# Patient Record
Sex: Male | Born: 1975
Health system: Southern US, Community
[De-identification: ages and names within clinical notes are randomized; demographics above are authoritative.]

## PROBLEM LIST (undated history)

## (undated) ENCOUNTER — Emergency Department (HOSPITAL_COMMUNITY): Payer: Self-pay | Source: Home / Self Care

## (undated) DIAGNOSIS — E785 Hyperlipidemia, unspecified: Secondary | ICD-10-CM

## (undated) DIAGNOSIS — I8390 Asymptomatic varicose veins of unspecified lower extremity: Secondary | ICD-10-CM

## (undated) DIAGNOSIS — K429 Umbilical hernia without obstruction or gangrene: Secondary | ICD-10-CM

## (undated) DIAGNOSIS — N200 Calculus of kidney: Secondary | ICD-10-CM

## (undated) DIAGNOSIS — J309 Allergic rhinitis, unspecified: Secondary | ICD-10-CM

## (undated) HISTORY — PX: FOOT SURGERY: SHX648

## (undated) HISTORY — DX: Allergic rhinitis, unspecified: J30.9

---

## 2005-12-27 ENCOUNTER — Inpatient Hospital Stay (HOSPITAL_COMMUNITY): Admission: EM | Admit: 2005-12-27 | Discharge: 2005-12-28 | Payer: Self-pay | Admitting: Emergency Medicine

## 2006-01-04 ENCOUNTER — Emergency Department (HOSPITAL_COMMUNITY): Admission: EM | Admit: 2006-01-04 | Discharge: 2006-01-04 | Payer: Self-pay | Admitting: Family Medicine

## 2006-05-04 ENCOUNTER — Emergency Department (HOSPITAL_COMMUNITY): Admission: EM | Admit: 2006-05-04 | Discharge: 2006-05-04 | Payer: Self-pay | Admitting: Family Medicine

## 2009-05-13 ENCOUNTER — Emergency Department (HOSPITAL_COMMUNITY): Admission: EM | Admit: 2009-05-13 | Discharge: 2009-05-13 | Payer: Self-pay | Admitting: Family Medicine

## 2009-07-27 ENCOUNTER — Ambulatory Visit: Payer: Self-pay | Admitting: Internal Medicine

## 2009-09-24 ENCOUNTER — Ambulatory Visit: Payer: Self-pay | Admitting: Internal Medicine

## 2009-09-24 ENCOUNTER — Encounter (INDEPENDENT_AMBULATORY_CARE_PROVIDER_SITE_OTHER): Payer: Self-pay | Admitting: Family Medicine

## 2009-09-24 LAB — CONVERTED CEMR LAB
AST: 27 units/L (ref 0–37)
BUN: 14 mg/dL (ref 6–23)
Basophils Absolute: 0.1 10*3/uL (ref 0.0–0.1)
CO2: 22 meq/L (ref 19–32)
Chloride: 106 meq/L (ref 96–112)
Creatinine, Ser: 0.71 mg/dL (ref 0.40–1.50)
Eosinophils Absolute: 0.3 10*3/uL (ref 0.0–0.7)
Eosinophils Relative: 4 % (ref 0–5)
HCT: 45.2 % (ref 39.0–52.0)
Lymphs Abs: 2 10*3/uL (ref 0.7–4.0)
Monocytes Absolute: 0.4 10*3/uL (ref 0.1–1.0)
Neutro Abs: 3.4 10*3/uL (ref 1.7–7.7)
Platelets: 208 10*3/uL (ref 150–400)
Total Bilirubin: 0.5 mg/dL (ref 0.3–1.2)
WBC: 6.2 10*3/uL (ref 4.0–10.5)

## 2009-09-26 ENCOUNTER — Ambulatory Visit (HOSPITAL_COMMUNITY): Admission: RE | Admit: 2009-09-26 | Discharge: 2009-09-26 | Payer: Self-pay | Admitting: Family Medicine

## 2009-11-05 ENCOUNTER — Ambulatory Visit: Payer: Self-pay | Admitting: Internal Medicine

## 2010-06-23 ENCOUNTER — Encounter: Payer: Self-pay | Admitting: Family Medicine
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
## 2010-12-24 ENCOUNTER — Other Ambulatory Visit (HOSPITAL_COMMUNITY): Payer: Self-pay | Admitting: Family Medicine

## 2010-12-24 ENCOUNTER — Ambulatory Visit (HOSPITAL_COMMUNITY)
Admission: RE | Admit: 2010-12-24 | Discharge: 2010-12-24 | Disposition: A | Discharge status: Home / Self Care | Payer: Self-pay | Source: Ambulatory Visit | Attending: Family Medicine | Admitting: Family Medicine

## 2010-12-24 DIAGNOSIS — R109 Unspecified abdominal pain: Secondary | ICD-10-CM | POA: Insufficient documentation

## 2011-03-16 ENCOUNTER — Inpatient Hospital Stay (INDEPENDENT_AMBULATORY_CARE_PROVIDER_SITE_OTHER)
Admission: RE | Admit: 2011-03-16 | Discharge: 2011-03-16 | Disposition: A | Discharge status: Home / Self Care | Payer: Self-pay | Source: Ambulatory Visit | Attending: Family Medicine | Admitting: Family Medicine

## 2011-03-16 DIAGNOSIS — J310 Chronic rhinitis: Secondary | ICD-10-CM

## 2011-03-16 DIAGNOSIS — J019 Acute sinusitis, unspecified: Secondary | ICD-10-CM

## 2012-10-07 ENCOUNTER — Emergency Department (HOSPITAL_COMMUNITY)
Admission: EM | Admit: 2012-10-07 | Discharge: 2012-10-08 | Disposition: A | Discharge status: Home / Self Care | Payer: Self-pay | Attending: Emergency Medicine | Admitting: Emergency Medicine

## 2012-10-07 ENCOUNTER — Other Ambulatory Visit: Payer: Self-pay

## 2012-10-07 ENCOUNTER — Emergency Department (HOSPITAL_COMMUNITY): Payer: Self-pay

## 2012-10-07 ENCOUNTER — Encounter (HOSPITAL_COMMUNITY): Payer: Self-pay | Admitting: *Deleted

## 2012-10-07 DIAGNOSIS — Z8639 Personal history of other endocrine, nutritional and metabolic disease: Secondary | ICD-10-CM | POA: Insufficient documentation

## 2012-10-07 DIAGNOSIS — R110 Nausea: Secondary | ICD-10-CM | POA: Insufficient documentation

## 2012-10-07 DIAGNOSIS — J020 Streptococcal pharyngitis: Secondary | ICD-10-CM | POA: Insufficient documentation

## 2012-10-07 DIAGNOSIS — R079 Chest pain, unspecified: Secondary | ICD-10-CM | POA: Insufficient documentation

## 2012-10-07 DIAGNOSIS — R509 Fever, unspecified: Secondary | ICD-10-CM | POA: Insufficient documentation

## 2012-10-07 DIAGNOSIS — R1084 Generalized abdominal pain: Secondary | ICD-10-CM | POA: Insufficient documentation

## 2012-10-07 DIAGNOSIS — F17200 Nicotine dependence, unspecified, uncomplicated: Secondary | ICD-10-CM | POA: Insufficient documentation

## 2012-10-07 DIAGNOSIS — M549 Dorsalgia, unspecified: Secondary | ICD-10-CM | POA: Insufficient documentation

## 2012-10-07 DIAGNOSIS — Z862 Personal history of diseases of the blood and blood-forming organs and certain disorders involving the immune mechanism: Secondary | ICD-10-CM | POA: Insufficient documentation

## 2012-10-07 DIAGNOSIS — J329 Chronic sinusitis, unspecified: Secondary | ICD-10-CM | POA: Insufficient documentation

## 2012-10-07 DIAGNOSIS — R0602 Shortness of breath: Secondary | ICD-10-CM | POA: Insufficient documentation

## 2012-10-07 DIAGNOSIS — H53149 Visual discomfort, unspecified: Secondary | ICD-10-CM | POA: Insufficient documentation

## 2012-10-07 HISTORY — DX: Hyperlipidemia, unspecified: E78.5

## 2012-10-07 LAB — CBC WITH DIFFERENTIAL/PLATELET
Basophils Absolute: 0 10*3/uL (ref 0.0–0.1)
Basophils Relative: 0 % (ref 0–1)
HCT: 39.9 % (ref 39.0–52.0)
Hemoglobin: 14.3 g/dL (ref 13.0–17.0)
Lymphs Abs: 1.2 10*3/uL (ref 0.7–4.0)
MCH: 30.5 pg (ref 26.0–34.0)
MCV: 85.1 fL (ref 78.0–100.0)
Monocytes Relative: 7 % (ref 3–12)
Platelets: 167 10*3/uL (ref 150–400)
RDW: 12.4 % (ref 11.5–15.5)

## 2012-10-07 LAB — BASIC METABOLIC PANEL
CO2: 26 mEq/L (ref 19–32)
GFR calc Af Amer: >90 mL/min (ref >90)
Glucose, Bld: 124 mg/dL — ABNORMAL HIGH (ref 70–99)
Potassium: 3.7 mEq/L (ref 3.5–5.1)
Sodium: 135 mEq/L (ref 135–145)

## 2012-10-07 LAB — URINALYSIS, ROUTINE W REFLEX MICROSCOPIC
Ketones, ur: NEGATIVE mg/dL
Leukocytes, UA: NEGATIVE
Nitrite: NEGATIVE
Specific Gravity, Urine: 1.024 (ref 1.005–1.030)

## 2012-10-07 LAB — POCT I-STAT TROPONIN I

## 2012-10-07 MED ORDER — ONDANSETRON HCL 4 MG/2ML IJ SOLN
4.0000 mg | Freq: Once | INTRAMUSCULAR | Status: AC
  Administered 2012-10-08: 4 mg via INTRAVENOUS
  Filled 2012-10-07: qty 2

## 2012-10-07 MED ORDER — MORPHINE SULFATE 4 MG/ML IJ SOLN
4.0000 mg | Freq: Once | INTRAMUSCULAR | Status: AC
  Administered 2012-10-08: 4 mg via INTRAVENOUS
  Filled 2012-10-07: qty 1

## 2012-10-07 MED ORDER — ACETAMINOPHEN 325 MG PO TABS
650.0000 mg | ORAL_TABLET | Freq: Four times a day (QID) | ORAL | Status: DC | PRN
  Administered 2012-10-08: 650 mg via ORAL
  Filled 2012-10-07: qty 2

## 2012-10-07 MED ORDER — SODIUM CHLORIDE 0.9 % IV BOLUS (SEPSIS)
1000.0000 mL | Freq: Once | INTRAVENOUS | Status: AC
  Administered 2012-10-08: 1000 mL via INTRAVENOUS

## 2012-10-07 MED ORDER — SODIUM CHLORIDE 0.9 % IV BOLUS (SEPSIS)
2000.0000 mL | Freq: Once | INTRAVENOUS | Status: AC
  Administered 2012-10-08: 2000 mL via INTRAVENOUS

## 2012-10-07 NOTE — ED Notes (Signed)
Patient transported to X-ray 

## 2012-10-07 NOTE — ED Notes (Addendum)
Really bad h/a and fever, sob, substernal cp.  Onset: today 12 Noon. Took Suphedrine and some old amoxicillin. Started feeling this way after having a burger at [employer]. [place].

## 2012-10-07 NOTE — ED Provider Notes (Signed)
History     CSN: [id number]  Arrival date & time 10/07/12  2222   First MD Initiated Contact with Patient 10/07/12 2326      Chief Complaint  Patient presents with  . Fever  . Headache  . Back Pain  . Chest Pain    (Consider location/radiation/quality/duration/timing/severity/associated sxs/prior treatment) HPI  Fred Mullins is a 37 y.o. male complaining acute onset of headache, shortness of breath, chest pain, nausea and sore throat at 12 PM. Patient felt fine this morning and went to work is normal. He denies sick contacts, cough, rhinorrhea, emesis, diarrhea, neck stiffness, rash. Patient has past medical history significant for hyperlipidemia he is currently not being treated for this he has no primary care physician. Patient states the pain is bothering him the most is the headache, it is periorbital and bilateral. Patient endorses a mild photophobia and diffuse abdominal discomdort. He states his chest pain is bilateral upper anterior and rated as 6/10. Patient works outside as a Administrator, denies any known recent tick bite.     Past Medical History  Diagnosis Date  . Hyperlipemia     No past surgical history on file.  No family history on file.  History  Substance Use Topics  . Smoking status: Light Tobacco Smoker  . Smokeless tobacco: Not on file  . Alcohol Use: Yes      Review of Systems  Constitutional: Positive for fever.  Respiratory: Positive for shortness of breath.   Cardiovascular: Negative for chest pain.  Gastrointestinal: Positive for nausea. Negative for vomiting, abdominal pain and diarrhea.  Neurological: Positive for headaches.  All other systems reviewed and are negative.    Allergies  Review of patient's allergies indicates no known allergies.  Home Medications  No current outpatient prescriptions on file.  BP 122/56  Pulse 104  Temp(Src) 101.1 F (38.4 C) (Oral)  Resp 16  SpO2 97%  Physical Exam  Nursing note and vitals  reviewed. Constitutional: He is oriented to person, place, and time. He appears well-developed and well-nourished. No distress.  Warm to the touch  HENT:  Head: Normocephalic.  Mouth/Throat: Oropharynx is clear and moist.  Poorly visualized tonsils.  Anterior cervical lymphadenopathy with very mild tenderness  Eyes: Conjunctivae and EOM are normal. Pupils are equal, round, and reactive to light.  Neck: Normal range of motion. Neck supple. No JVD present. No thyromegaly present.  Patient has full range of motion to neck. No tenderness to deep palpation of the posterior cervical spine. Patient can flex chin to chest with no pain.  +Acanthosis Nigricans  Cardiovascular: Normal rate, regular rhythm, normal heart sounds and intact distal pulses.   Pulmonary/Chest: Effort normal and breath sounds normal. No stridor. No respiratory distress. He has no wheezes. He has no rales. He exhibits no tenderness.  Abdominal: Soft. Bowel sounds are normal. He exhibits no distension and no mass. There is no tenderness. There is no rebound and no guarding.  Musculoskeletal: Normal range of motion. He exhibits no edema and no tenderness.  Neurological: He is alert and oriented to person, place, and time.  Skin: Skin is warm. No rash noted.  Psychiatric: He has a normal mood and affect.    ED Course  Procedures (including critical care time)  Labs Reviewed  RAPID STREP SCREEN - Abnormal; Notable for the following:    Streptococcus, Group A Screen (Direct) POSITIVE (*)    All other components within normal limits  BASIC METABOLIC PANEL - Abnormal; Notable for the following:  Glucose, Bld 124 (*)    GFR calc non Af Amer 87 (*)    All other components within normal limits  CBC WITH DIFFERENTIAL - Abnormal; Notable for the following:    WBC 11.7 (*)    Neutrophils Relative 82 (*)    Neutro Abs 9.6 (*)    Lymphocytes Relative 10 (*)    All other components within normal limits  URINALYSIS, ROUTINE W  REFLEX MICROSCOPIC  POCT I-STAT TROPONIN I  POCT I-STAT TROPONIN I   Dg Chest 2 View  10/07/2012  *RADIOLOGY REPORT*  Clinical Data: 37 year old male.  Fever headache back pain chest pain.  CHEST - 2 VIEW  Comparison: 09/26/2009.  Findings: Lower lung volumes.  Cardiac size at the upper limits of normal. Other mediastinal contours are within normal limits. Visualized tracheal air column is within normal limits.  No pneumothorax or pleural effusion.  Mild increased interstitial markings, favor crowding.  No consolidation. No acute osseous abnormality identified.  IMPRESSION: Lower lung volumes, otherwise no acute cardiopulmonary abnormality.   Original Report Authenticated By: Erskine Speed, M.D.    Ct Head Wo Contrast  10/08/2012  *RADIOLOGY REPORT*  Clinical Data: 37 year old male with frontal headache.  Severe headache.  CT HEAD WITHOUT CONTRAST  Technique:  Contiguous axial images were obtained from the base of the skull through the vertex without contrast.  Comparison: None.  Findings: Mild anterior ethmoid sinus mucosal thickening.  Trace bubbly opacity in the sphenoid sinuses.  Other Visualized paranasal sinuses and mastoids are clear.  No acute osseous abnormality identified.  Visualized orbits and scalp soft tissues are within normal limits.  Normal cerebral volume. Cerebral volume is within normal limits for age.  No midline shift, ventriculomegaly, mass effect, evidence of mass lesion, intracranial hemorrhage or evidence of cortically based acute infarction.  Gray-white matter differentiation is within normal limits throughout the brain.  No suspicious intracranial vascular hyperdensity.  IMPRESSION: 1. Normal noncontrast CT appearance of the brain. 2.  Mild ethmoid and sphenoid sinus inflammatory changes.   Original Report Authenticated By: Erskine Speed, M.D.     Date: 10/08/2012  Rate: 108  Rhythm: Sinus tachycardia  QRS Axis: normal  Intervals: normal  ST/T Wave abnormalities: normal   Conduction Disutrbances:none  Narrative Interpretation:   Old EKG Reviewed: None available   1. Strep pharyngitis   2. Sinusitis       MDM   Fred Mullins is a 37 y.o. male with headache, myalgia, fever, sore throat. Patient has no meningeal signs. He denies sore throat. Patient works outside however he does not have any rash or any recent tick bites.  CT shows sinus this. We'll treat him with amoxicillin and fluticasone nasal spray.  I think his chest pain is related to diffuse myalgia from the strep throat.  I think his pain is related to stress and sinusitis doubt meningitis or Mountain spotted fever at this time.  Filed Vitals:   10/07/12 2241  BP: 122/56  Pulse: 104  Temp: 101.1 F (38.4 C)  TempSrc: Oral  Resp: 16  SpO2: 97%     VSS and patient is appropriate for, and amenable to, discharge at this time. Pt verbalized understanding and agrees with care plan. Outpatient follow-up and return precautions given.    New Prescriptions   AMOXICILLIN (AMOXIL) 500 MG CAPSULE    Take 1 capsule (500 mg total) by mouth 3 (three) times daily.   FLUTICASONE (FLONASE) 50 MCG/ACT NASAL SPRAY    Place 2 sprays  into the nose daily.           Wynetta Emery, PA-C 10/08/12 0148

## 2012-10-08 ENCOUNTER — Emergency Department (HOSPITAL_COMMUNITY): Payer: Self-pay

## 2012-10-08 LAB — POCT I-STAT TROPONIN I: Troponin i, poc: 0 ng/mL (ref 0.00–0.08)

## 2012-10-08 LAB — RAPID STREP SCREEN (MED CTR MEBANE ONLY): Streptococcus, Group A Screen (Direct): POSITIVE — AB

## 2012-10-08 MED ORDER — AMOXICILLIN 500 MG PO CAPS: 500.0000 mg | ORAL_CAPSULE | Freq: Three times a day (TID) | ORAL | Status: DC

## 2012-10-08 MED ORDER — LIDOCAINE VISCOUS 2 % MT SOLN
20.0000 mL | Freq: Once | OROMUCOSAL | Status: AC
  Administered 2012-10-08: 20 mL via OROMUCOSAL
  Filled 2012-10-08: qty 15

## 2012-10-08 MED ORDER — PENICILLIN G BENZATHINE 1200000 UNIT/2ML IM SUSP
1.2000 10*6.[IU] | Freq: Once | INTRAMUSCULAR | Status: AC
  Administered 2012-10-08: 1.2 10*6.[IU] via INTRAMUSCULAR
  Filled 2012-10-08: qty 2

## 2012-10-08 MED ORDER — FLUTICASONE PROPIONATE 50 MCG/ACT NA SUSP: 2.0000 | Freq: Every day | NASAL | Status: DC

## 2012-10-08 NOTE — ED Provider Notes (Signed)
Medical screening examination/treatment/procedure(s) were performed by non-physician practitioner and as supervising physician I was immediately available for consultation/collaboration.  Jasmine Awe, MD 10/08/12 [phone]

## 2012-12-15 ENCOUNTER — Ambulatory Visit: Payer: No Typology Code available for payment source | Attending: Family Medicine | Admitting: Family Medicine

## 2012-12-15 ENCOUNTER — Encounter: Payer: Self-pay | Admitting: Family Medicine

## 2012-12-15 VITALS — BP 110/62 | HR 72 | Temp 99.1°F | Resp 18 | Ht 67.0 in | Wt 197.0 lb

## 2012-12-15 DIAGNOSIS — K029 Dental caries, unspecified: Secondary | ICD-10-CM

## 2012-12-15 DIAGNOSIS — J309 Allergic rhinitis, unspecified: Secondary | ICD-10-CM

## 2012-12-15 DIAGNOSIS — G4710 Hypersomnia, unspecified: Secondary | ICD-10-CM | POA: Insufficient documentation

## 2012-12-15 DIAGNOSIS — R51 Headache: Secondary | ICD-10-CM

## 2012-12-15 HISTORY — DX: Allergic rhinitis, unspecified: J30.9

## 2012-12-15 MED ORDER — CETIRIZINE HCL 10 MG PO TABS: 10.0000 mg | ORAL_TABLET | Freq: Every day | ORAL | Status: DC

## 2012-12-15 MED ORDER — FLUTICASONE PROPIONATE 50 MCG/ACT NA SUSP: 2.0000 | Freq: Every day | NASAL | Status: DC

## 2012-12-15 MED ORDER — TRAZODONE HCL 50 MG PO TABS: 50.0000 mg | ORAL_TABLET | Freq: Every evening | ORAL | Status: DC | PRN

## 2012-12-15 NOTE — Progress Notes (Signed)
12/15/12 Patient present to clinic to establish care. Stated he needs A dental referral. C/O headache  Intermittently. P.Dell Hurtubise,RN BSN [facility]

## 2012-12-15 NOTE — Progress Notes (Signed)
Patient ID: Fred Mullins, male   DOB: [birth year], 37 y.o.   MRN: [id number]  CC: headaches  INterpreter  Used    HPI: Pt reports that he has intermittent frontal headaches and has been present for long time.  He is not sleeping well.  He is snoring and waking up to gasp for air at times.  He is tired all the time.  He has never been evaluated for OSA.  He says headaches have been 1-2/10 in severity.  HE says he is having a difficult time sleeping.   No Known Allergies Past Medical History  Diagnosis Date  . Hyperlipemia    No current outpatient prescriptions on file prior to visit.   No current facility-administered medications on file prior to visit.   Family History  Problem Relation Age of Onset  . Hyperlipidemia Mother   . Diabetes Father    History   Social History  . Marital Status: Single    Spouse Name: N/A    Number of Children: N/A  . Years of Education: N/A   Occupational History  . Not on file.   Social History Main Topics  . Smoking status: Light Tobacco Smoker  . Smokeless tobacco: Not on file  . Alcohol Use: Yes  . Drug Use: No  . Sexually Active: Not on file   Other Topics Concern  . Not on file   Social History Narrative  . No narrative on file    Review of Systems  Constitutional: Negative for fever, chills, diaphoresis, activity change, appetite change and positive for chronic fatigue and daytime somnolence.   HENT: Negative for ear pain, nosebleeds, congestion, facial swelling, rhinorrhea, neck pain, neck stiffness and ear discharge.   Eyes: Negative for pain, discharge, redness, itching and visual disturbance.  Respiratory: Negative for cough, choking, chest tightness, shortness of breath, wheezing and stridor.   Cardiovascular: Negative for chest pain, palpitations and leg swelling.  Gastrointestinal: Negative for abdominal distention.  Genitourinary: Negative for dysuria, urgency, frequency, hematuria, flank pain, decreased urine volume,  difficulty urinating and dyspareunia.  Musculoskeletal: Negative for back pain, joint swelling, arthralgias and gait problem.  Neurological: Negative for dizziness, tremors, seizures, syncope, facial asymmetry, speech difficulty, weakness, light-headedness, numbness and headaches.  Hematological: Negative for adenopathy. Does not bruise/bleed easily.  Psychiatric/Behavioral: Negative for hallucinations, behavioral problems, confusion, dysphoric mood, decreased concentration and agitation.    Objective:   Filed Vitals:   12/15/12 1605  BP: 110/62  Pulse: 72  Temp: 99.1 F (37.3 C)  Resp: 18    Physical Exam  Constitutional: Appears well-developed and well-nourished. No distress.  HENT: Normocephalic. External right and left ear normal. Oropharynx is clear and moist. dental caries. Eyes: Conjunctivae and EOM are normal. PERRLA, no scleral icterus.  Neck: Normal ROM. Neck supple. No JVD. No tracheal deviation. No thyromegaly.  CVS: RRR, S1/S2 +, no murmurs, no gallops, no carotid bruit.  Pulmonary: Effort and breath sounds normal, no stridor, rhonchi, wheezes, rales.  Abdominal: Soft. BS +,  no distension, tenderness, rebound or guarding.  Musculoskeletal: Normal range of motion. No edema and no tenderness.  Lymphadenopathy: No lymphadenopathy noted, cervical, inguinal. Neuro: Alert. Normal reflexes, muscle tone coordination. No cranial nerve deficit. Skin: Skin is warm and dry. No rash noted. Not diaphoretic. No erythema. No pallor.  Psychiatric: Normal mood and affect. Behavior, judgment, thought content normal.   Lab Results  Component Value Date   WBC 11.7* 10/07/2012   HGB 14.3 10/07/2012   HCT 39.9 10/07/2012  MCV 85.1 10/07/2012   PLT 167 10/07/2012   Lab Results  Component Value Date   CREATININE 1.06 10/07/2012   BUN 12 10/07/2012   NA 135 10/07/2012   K 3.7 10/07/2012   CL 99 10/07/2012   CO2 26 10/07/2012    No results found for this basename: HGBA1C   Lipid Panel  No results  found for this basename: chol, trig, hdl, cholhdl, vldl, ldlcalc       Assessment and plan:    Hypersomnia with sleep apnea, unspecified - Plan: Split night study, Ambulatory referral to Dentistry  Allergic rhinitis - Plan: Split night study, Ambulatory referral to Dentistry  Chronic headaches - Plan: Split night study, Ambulatory referral to Dentistry  Dental caries - Plan: Ambulatory referral to Dentistry   OSA suspected Chronic headaches Allergic rhinitis Insomnia   Flonase NS cetiriizne 10 mg po daily Refer for sleep study  RTC in 3 months  The patient was given clear instructions to go to ER or return to medical center if symptoms don't improve, worsen or new problems develop.  The patient verbalized understanding.  The patient was told to call to get any lab results if not heard anything in the next week.    Rodney Langton, MD, CDE, FAAFP [facility] [place], [place]

## 2012-12-16 ENCOUNTER — Encounter: Payer: Self-pay | Admitting: Family Medicine

## 2012-12-16 DIAGNOSIS — K029 Dental caries, unspecified: Secondary | ICD-10-CM | POA: Insufficient documentation

## 2013-01-18 ENCOUNTER — Encounter (HOSPITAL_BASED_OUTPATIENT_CLINIC_OR_DEPARTMENT_OTHER): Payer: No Typology Code available for payment source

## 2013-02-14 ENCOUNTER — Ambulatory Visit: Payer: No Typology Code available for payment source | Admitting: Family Medicine

## 2013-07-05 ENCOUNTER — Encounter: Payer: Self-pay | Admitting: Internal Medicine

## 2013-07-05 ENCOUNTER — Ambulatory Visit: Payer: No Typology Code available for payment source | Attending: Internal Medicine | Admitting: Internal Medicine

## 2013-07-05 VITALS — BP 115/81 | HR 81 | Temp 98.8°F | Resp 15 | Ht 67.0 in | Wt 199.0 lb

## 2013-07-05 DIAGNOSIS — E785 Hyperlipidemia, unspecified: Secondary | ICD-10-CM | POA: Insufficient documentation

## 2013-07-05 DIAGNOSIS — J309 Allergic rhinitis, unspecified: Secondary | ICD-10-CM | POA: Insufficient documentation

## 2013-07-05 DIAGNOSIS — Z0000 Encounter for general adult medical examination without abnormal findings: Secondary | ICD-10-CM

## 2013-07-05 DIAGNOSIS — L600 Ingrowing nail: Secondary | ICD-10-CM

## 2013-07-05 DIAGNOSIS — M79674 Pain in right toe(s): Secondary | ICD-10-CM

## 2013-07-05 DIAGNOSIS — F17200 Nicotine dependence, unspecified, uncomplicated: Secondary | ICD-10-CM | POA: Insufficient documentation

## 2013-07-05 DIAGNOSIS — Z139 Encounter for screening, unspecified: Secondary | ICD-10-CM

## 2013-07-05 DIAGNOSIS — Z008 Encounter for other general examination: Secondary | ICD-10-CM | POA: Insufficient documentation

## 2013-07-05 DIAGNOSIS — M79675 Pain in left toe(s): Secondary | ICD-10-CM

## 2013-07-05 DIAGNOSIS — M79609 Pain in unspecified limb: Secondary | ICD-10-CM

## 2013-07-05 DIAGNOSIS — Z79899 Other long term (current) drug therapy: Secondary | ICD-10-CM | POA: Insufficient documentation

## 2013-07-05 NOTE — Patient Instructions (Signed)
patient advised to soak feet in warm soapy water for 10-15 minutes 2-3 times a day for 2-3 weeks..Marland Kitchen

## 2013-07-05 NOTE — Progress Notes (Signed)
Patient Demographics  Fred Mullins, is a 38 y.o. male  ZOX:[id number]  WJX:[id number]  DOB - 07/29/1975  CC:  Chief Complaint  Patient presents with  . Annual Exam       HPI: Fred Mullins is a 38 y.o. male here today for physical examination, patient has history of allergic rhinitis and is using Flonase, Patient has No headache, No chest pain, No abdominal pain - No Nausea, No new weakness tingling or numbness, No Cough - SOB. Patient reported to have ingrown toenail for a few months denies any fever chills any discharge also reported to have bilateral feet pain of long-standing count denies any trauma, patient also wants to be checked for TB and also has family history of diabetes want to be checked for that as well. No Known Allergies Past Medical History  Diagnosis Date  . Hyperlipemia   . Allergic rhinitis 12/15/2012   Current Outpatient Prescriptions on File Prior to Visit  Medication Sig Dispense Refill  . cetirizine (ZYRTEC) 10 MG tablet Take 1 tablet (10 mg total) by mouth daily.  30 tablet  11  . fluticasone (FLONASE) 50 MCG/ACT nasal spray Place 2 sprays into the nose daily.  16 g  2  . traZODone (DESYREL) 50 MG tablet Take 1 tablet (50 mg total) by mouth at bedtime as needed for sleep.  30 tablet  1   No current facility-administered medications on file prior to visit.   Family History  Problem Relation Age of Onset  . Hyperlipidemia Mother   . Diabetes Father    History   Social History  . Marital Status: Single    Spouse Name: N/A    Number of Children: N/A  . Years of Education: N/A   Occupational History  . Not on file.   Social History Main Topics  . Smoking status: Light Tobacco Smoker  . Smokeless tobacco: Not on file  . Alcohol Use: Yes  . Drug Use: No  . Sexual Activity: Not on file   Other Topics Concern  . Not on file   Social History Narrative  . No narrative on file    Review of Systems: Constitutional: Negative for fever,  chills, diaphoresis, activity change, appetite change and fatigue. HENT: Negative for ear pain, nosebleeds, congestion, facial swelling, rhinorrhea, neck pain, neck stiffness and ear discharge.  Eyes: Negative for pain, discharge, redness, itching and visual disturbance. Respiratory: Negative for cough, choking, chest tightness, shortness of breath, wheezing and stridor.  Cardiovascular: Negative for chest pain, palpitations and leg swelling. Gastrointestinal: Negative for abdominal distention. Genitourinary: Negative for dysuria, urgency, frequency, hematuria, flank pain, decreased urine volume, difficulty urinating and dyspareunia.  Musculoskeletal: Negative for back pain, joint swelling, arthralgia and gait problem. + Ingrown toenail Neurological: Negative for dizziness, tremors, seizures, syncope, facial asymmetry, speech difficulty, weakness, light-headedness, numbness and headaches.  Hematological: Negative for adenopathy. Does not bruise/bleed easily. Psychiatric/Behavioral: Negative for hallucinations, behavioral problems, confusion, dysphoric mood, decreased concentration and agitation.    Objective:   Filed Vitals:   07/05/13 1003  BP: 115/81  Pulse: 81  Temp: 98.8 F (37.1 C)  Resp: 15    Physical Exam: Constitutional: Patient appears well-developed and well-nourished. No distress. HENT: Normocephalic, atraumatic, External right and left ear normal. Oropharynx is clear and moist.  Eyes: Conjunctivae and EOM are normal. PERRLA, no scleral icterus. Neck: Normal ROM. Neck supple. No JVD. No tracheal deviation. No thyromegaly. CVS: RRR, S1/S2 +, no murmurs, no gallops, no carotid bruit.  Pulmonary: Effort and  breath sounds normal, no stridor, rhonchi, wheezes, rales.  Abdominal: Soft. BS +, no distension, tenderness, rebound or guarding.  Musculoskeletal: Normal range of motion. No edema and no tenderness. + Ingrown toenail  Lymphadenopathy: No lymphadenopathy noted, cervical,  inguinal or axillary Neuro: Alert. Normal reflexes, muscle tone coordination. No cranial nerve deficit. Skin: Skin is warm and dry. No rash noted. Not diaphoretic. No erythema. No pallor. Psychiatric: Normal mood and affect. Behavior, judgment, thought content normal.  Lab Results  Component Value Date   WBC 11.7* 10/07/2012   HGB 14.3 10/07/2012   HCT 39.9 10/07/2012   MCV 85.1 10/07/2012   PLT 167 10/07/2012   Lab Results  Component Value Date   CREATININE 1.06 10/07/2012   BUN 12 10/07/2012   NA 135 10/07/2012   K 3.7 10/07/2012   CL 99 10/07/2012   CO2 26 10/07/2012    No results found for this basename: HGBA1C   Lipid Panel  No results found for this basename: chol, trig, hdl, cholhdl, vldl, ldlcalc       Assessment and plan:   1. Annual physical exam  Annual physical exertion done today. - PPD  2. Screening  - PPD - CBC with Differential; Future - COMPLETE METABOLIC PANEL WITH GFR; Future - Lipid panel; Future - TSH; Future - Vit D  25 hydroxy (rtn osteoporosis monitoring); Future - Hemoglobin A1c; Future  The patient will come back for PPD reading and fasting blood work.  3. Ingrown toenail   patient advised to soak feet in warm soapy water for 10-15 minutes 2-3 times a day for 2-3 weeks..  - Ambulatory referral to Podiatry  4. Pain of toes of both feet Tylenol when necessary  - Ambulatory referral to Podiatry  Return in about 3 months (around 10/02/2013).     Doris CheadleADVANI, Brinsley Wence, MD

## 2013-07-05 NOTE — Progress Notes (Signed)
Pt here for physical exam to  Check risk for cholesterol and diabetes Requesting PPD-exposure of a friend Need referral foot doctor for in grown toenail [employer] interpretor used for [place]

## 2013-07-07 ENCOUNTER — Ambulatory Visit: Payer: No Typology Code available for payment source | Attending: Internal Medicine

## 2013-07-07 DIAGNOSIS — Z139 Encounter for screening, unspecified: Secondary | ICD-10-CM

## 2013-07-07 DIAGNOSIS — Z0000 Encounter for general adult medical examination without abnormal findings: Secondary | ICD-10-CM

## 2013-07-07 LAB — CBC WITH DIFFERENTIAL/PLATELET
Basophils Absolute: 0.1 10*3/uL (ref 0.0–0.1)
Basophils Relative: 1 % (ref 0–1)
EOS PCT: 3 % (ref 0–5)
Eosinophils Absolute: 0.2 10*3/uL (ref 0.0–0.7)
HEMATOCRIT: 43.4 % (ref 39.0–52.0)
Hemoglobin: 14.9 g/dL (ref 13.0–17.0)
LYMPHS ABS: 2.1 10*3/uL (ref 0.7–4.0)
LYMPHS PCT: 35 % (ref 12–46)
MCH: 29.5 pg (ref 26.0–34.0)
MCHC: 34.3 g/dL (ref 30.0–36.0)
MCV: 85.9 fL (ref 78.0–100.0)
Monocytes Absolute: 0.4 10*3/uL (ref 0.1–1.0)
Monocytes Relative: 7 % (ref 3–12)
NEUTROS ABS: 3.2 10*3/uL (ref 1.7–7.7)
Neutrophils Relative %: 54 % (ref 43–77)
PLATELETS: 208 10*3/uL (ref 150–400)
RBC: 5.05 MIL/uL (ref 4.22–5.81)
RDW: 13.6 % (ref 11.5–15.5)
WBC: 5.9 10*3/uL (ref 4.0–10.5)

## 2013-07-07 LAB — LIPID PANEL
Cholesterol: 161 mg/dL (ref 0–200)
HDL: 36 mg/dL — ABNORMAL LOW (ref >39)
Total CHOL/HDL Ratio: 4.5 Ratio
Triglycerides: 444 mg/dL — ABNORMAL HIGH (ref <150)

## 2013-07-07 LAB — COMPLETE METABOLIC PANEL WITH GFR
ALBUMIN: 4.3 g/dL (ref 3.5–5.2)
ALT: 50 U/L (ref 0–53)
AST: 32 U/L (ref 0–37)
Alkaline Phosphatase: 72 U/L (ref 39–117)
BUN: 13 mg/dL (ref 6–23)
CALCIUM: 9.3 mg/dL (ref 8.4–10.5)
CHLORIDE: 104 meq/L (ref 96–112)
CO2: 25 meq/L (ref 19–32)
CREATININE: 0.77 mg/dL (ref 0.50–1.35)
GFR, Est Non African American: >89 mL/min
Glucose, Bld: 108 mg/dL — ABNORMAL HIGH (ref 70–99)
POTASSIUM: 4.3 meq/L (ref 3.5–5.3)
Sodium: 138 mEq/L (ref 135–145)
TOTAL PROTEIN: 6.9 g/dL (ref 6.0–8.3)
Total Bilirubin: 0.6 mg/dL (ref 0.2–1.2)

## 2013-07-07 LAB — TB SKIN TEST
INDURATION: 1 mm
TB Skin Test: NEGATIVE

## 2013-07-07 LAB — HEMOGLOBIN A1C
HEMOGLOBIN A1C: 6 % — AB (ref <5.7)
MEAN PLASMA GLUCOSE: 126 mg/dL — AB (ref <117)

## 2013-07-07 LAB — TSH: TSH: 1.047 u[IU]/mL (ref 0.350–4.500)

## 2013-07-08 ENCOUNTER — Other Ambulatory Visit: Payer: No Typology Code available for payment source

## 2013-07-08 LAB — VITAMIN D 25 HYDROXY (VIT D DEFICIENCY, FRACTURES): VIT D 25 HYDROXY: 23 ng/mL — AB (ref 30–89)

## 2013-07-09 ENCOUNTER — Telehealth: Payer: Self-pay | Admitting: Emergency Medicine

## 2013-07-09 MED ORDER — FENOFIBRATE 145 MG PO TABS: 145.0000 mg | ORAL_TABLET | Freq: Every day | ORAL | Status: DC

## 2013-07-09 MED ORDER — VITAMIN D (ERGOCALCIFEROL) 1.25 MG (50000 UNIT) PO CAPS: 50000.0000 [IU] | ORAL_CAPSULE | ORAL | Status: DC

## 2013-07-09 NOTE — Telephone Encounter (Signed)
Pt given lab results with medication instructions. Wife translated for Spanish

## 2013-07-09 NOTE — Telephone Encounter (Signed)
Message copied by Darlis LoanSMITH, JILL D on Sat Jul 09, 2013  9:35 AM ------      Message from: Doris CheadleADVANI, DEEPAK      Created: Fri Jul 08, 2013  1:46 PM       Blood work reviewed, noticed low vitamin D, call patient advise to start ergocalciferol 50,000 units once a week for the duration of  12 weeks.       noticed impaired fasting glucose, call and advise patient for low carbohydrate diet.       noticed elevated triglycerides, advise patient for low fat diet. And start taking fenofibrate 145 mg daily             ------

## 2013-07-18 ENCOUNTER — Encounter: Payer: Self-pay | Admitting: Podiatry

## 2013-07-18 ENCOUNTER — Ambulatory Visit (INDEPENDENT_AMBULATORY_CARE_PROVIDER_SITE_OTHER): Payer: No Typology Code available for payment source | Admitting: Podiatry

## 2013-07-18 DIAGNOSIS — L600 Ingrowing nail: Secondary | ICD-10-CM

## 2013-07-18 NOTE — Patient Instructions (Signed)
I offered patient nail surgery to correct ingrown toenails with local anesthesia. Patient refused the injection because of apprehension about injection. I offered oral Valium and reschedule to have procedure done and patient refused.

## 2013-07-18 NOTE — Progress Notes (Signed)
   Subjective:    Patient ID: Fred Mullins, male    DOB: 02/20/1976, 38 y.o.   MRN: [id number]  HPI I have two ingrown toenails that are bothering and has been going on for about 2 months and I did trim on them and now I cant get to the nail and no soreness or tenderness and no draining and the bunion on my right foot has been going on for about 3 months and hurts when I walk . This Hispanic male presents with an interpreter and speaks and does not understanding [place]. His primary reason for presentation today her painful chronic ingrown toenails the patient apparently debrided this and trims himself, however, becoming progressively more comfortable.  There is a secondary complaint of painful bunions, without a specific history treatment.    Review of Systems  HENT: Positive for sinus pressure.   All other systems reviewed and are negative.       Objective:   Physical Exam  38 year old Hispanic male non-English-speaking presents with interpreter who speaks with indirectly   vascular: DP and PT pulses are 2/4 bilaterally. Capillary fill is immediate bilaterally.  Neurological: Sensation intact bilaterally  Dermatological: Incurvation of the medial margins of the right and left hallux toenails are noted with palpable tenderness in the medial lateral borders of the hallux nails. There is no erythema , edema or drainage noted about the hallux nail margins.  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 The patient became quite agitated he said he would not be able to tolerate local anesthesia through his interpreter. I offered a revisit with oral Valium for relaxation prior to his scheduled visit and the patient refused this option.  At this time  patient elected no treatment and left the treatment room.

## 2013-07-19 ENCOUNTER — Encounter: Payer: Self-pay | Admitting: Podiatry

## 2013-08-05 ENCOUNTER — Other Ambulatory Visit: Payer: No Typology Code available for payment source

## 2013-08-09 ENCOUNTER — Ambulatory Visit: Payer: No Typology Code available for payment source | Attending: Internal Medicine

## 2013-08-09 DIAGNOSIS — E7800 Pure hypercholesterolemia, unspecified: Secondary | ICD-10-CM

## 2013-08-09 LAB — LIPID PANEL
Cholesterol: 160 mg/dL (ref 0–200)
HDL: 35 mg/dL — ABNORMAL LOW (ref >39)
LDL CALC: 84 mg/dL (ref 0–99)
Total CHOL/HDL Ratio: 4.6 Ratio
Triglycerides: 203 mg/dL — ABNORMAL HIGH (ref <150)
VLDL: 41 mg/dL — ABNORMAL HIGH (ref 0–40)

## 2013-08-12 ENCOUNTER — Telehealth: Payer: Self-pay | Admitting: Internal Medicine

## 2013-08-15 ENCOUNTER — Telehealth: Payer: Self-pay | Admitting: Internal Medicine

## 2013-08-15 NOTE — Telephone Encounter (Signed)
Pt called regarding the results of his lab work, please contact pt as soon as possible

## 2013-08-17 NOTE — Telephone Encounter (Signed)
Labs was drawn 08/09/13

## 2013-08-19 ENCOUNTER — Telehealth: Payer: Self-pay | Admitting: Internal Medicine

## 2013-08-19 MED ORDER — FENOFIBRATE 145 MG PO TABS: 145.0000 mg | ORAL_TABLET | Freq: Every day | ORAL | Status: DC

## 2013-08-19 NOTE — Telephone Encounter (Signed)
Pt wife given lab results with instructions to continue taking medication Tricor 145 mg dose until next OV [employer] interpretor line used for communication

## 2013-08-19 NOTE — Telephone Encounter (Signed)
Pt has come in today to get the results of his last lab test; pt is in the lobby with his interpreter; please f/u with pt

## 2013-10-04 ENCOUNTER — Ambulatory Visit: Payer: No Typology Code available for payment source | Admitting: Internal Medicine

## 2013-11-04 ENCOUNTER — Encounter: Payer: Self-pay | Admitting: Internal Medicine

## 2013-11-04 ENCOUNTER — Ambulatory Visit: Payer: No Typology Code available for payment source | Attending: Internal Medicine | Admitting: Internal Medicine

## 2013-11-04 VITALS — BP 123/81 | HR 93 | Temp 98.3°F | Resp 17 | Wt 196.2 lb

## 2013-11-04 DIAGNOSIS — K5900 Constipation, unspecified: Secondary | ICD-10-CM | POA: Insufficient documentation

## 2013-11-04 DIAGNOSIS — K649 Unspecified hemorrhoids: Secondary | ICD-10-CM | POA: Insufficient documentation

## 2013-11-04 DIAGNOSIS — Z833 Family history of diabetes mellitus: Secondary | ICD-10-CM | POA: Insufficient documentation

## 2013-11-04 DIAGNOSIS — E559 Vitamin D deficiency, unspecified: Secondary | ICD-10-CM | POA: Insufficient documentation

## 2013-11-04 DIAGNOSIS — E781 Pure hyperglyceridemia: Secondary | ICD-10-CM | POA: Insufficient documentation

## 2013-11-04 LAB — LIPID PANEL
Cholesterol: 167 mg/dL (ref 0–200)
HDL: 41 mg/dL (ref >39)
LDL CALC: 83 mg/dL (ref 0–99)
TRIGLYCERIDES: 215 mg/dL — AB (ref <150)
Total CHOL/HDL Ratio: 4.1 Ratio
VLDL: 43 mg/dL — ABNORMAL HIGH (ref 0–40)

## 2013-11-04 LAB — POCT GLYCOSYLATED HEMOGLOBIN (HGB A1C): HEMOGLOBIN A1C: 5.7

## 2013-11-04 LAB — GLUCOSE, POCT (MANUAL RESULT ENTRY): POC GLUCOSE: 94 mg/dL (ref 70–99)

## 2013-11-04 MED ORDER — VITAMIN D (ERGOCALCIFEROL) 1.25 MG (50000 UNIT) PO CAPS: 50000.0000 [IU] | ORAL_CAPSULE | ORAL | Status: DC

## 2013-11-04 MED ORDER — HYDROCORTISONE ACETATE 25 MG RE SUPP: 25.0000 mg | Freq: Two times a day (BID) | RECTAL | Status: DC

## 2013-11-04 MED ORDER — POLYETHYLENE GLYCOL 3350 17 GM/SCOOP PO POWD: 17.0000 g | Freq: Two times a day (BID) | ORAL | Status: DC | PRN

## 2013-11-04 NOTE — Progress Notes (Signed)
Patient here with interpreter Complains of having some discomfort from hemorrhoids Would also like his cholesterol checked

## 2013-11-04 NOTE — Progress Notes (Signed)
MRN: [id number] Name: Fred Mullins  Sex: male Age: 38 y.o. DOB: 27-Feb-1976  Allergies: Review of patient's allergies indicates no known allergies.  Chief Complaint  Patient presents with  . Follow-up    HPI: Patient is 38 y.o. male who history of hypertriglyceridemia comes today for followup her as per patient for the last one month he has not taken TriCor because as per patient he was told his triglycerides are improved, he modified his diet and wants blood work to be checked I have discussed with patient if triglycerides in the blood work showed elevated he will start taking the medication, patient also reported history of hemorrhoids for the last 2 years and is requesting some medication he has is she of constipation as per patient he took laxative in the past and is requesting refill on that. Patient denies any nausea vomiting, he has family history of diabetes his last hemoglobin A1c was 6.0%, today it is improved to 5.7% encouraged to continue with low carbohydrate diet.  Past Medical History  Diagnosis Date  . Hyperlipemia   . Allergic rhinitis 12/15/2012    History reviewed. No pertinent past surgical history.    Medication List       This list is accurate as of: 11/04/13 12:16 PM.  Always use your most recent med list.               cetirizine 10 MG tablet  Commonly known as:  ZYRTEC  Take 1 tablet (10 mg total) by mouth daily.     fenofibrate 145 MG tablet  Commonly known as:  TRICOR  Take 1 tablet (145 mg total) by mouth daily.     fluticasone 50 MCG/ACT nasal spray  Commonly known as:  FLONASE  Place 2 sprays into the nose daily.     hydrocortisone 25 MG suppository  Commonly known as:  ANUSOL-HC  Place 1 suppository (25 mg total) rectally 2 (two) times daily.     polyethylene glycol powder powder  Commonly known as:  GLYCOLAX/MIRALAX  Take 17 g by mouth 2 (two) times daily as needed.     traZODone 50 MG tablet  Commonly known as:  DESYREL  Take 1  tablet (50 mg total) by mouth at bedtime as needed for sleep.     Vitamin D (Ergocalciferol) 50000 UNITS Caps capsule  Commonly known as:  DRISDOL  Take 1 capsule (50,000 Units total) by mouth every 7 (seven) days.        Meds ordered this encounter  Medications  . hydrocortisone (ANUSOL-HC) 25 MG suppository    Sig: Place 1 suppository (25 mg total) rectally 2 (two) times daily.    Dispense:  12 suppository    Refill:  0  . polyethylene glycol powder (GLYCOLAX/MIRALAX) powder    Sig: Take 17 g by mouth 2 (two) times daily as needed.    Dispense:  3350 g    Refill:  1  . Vitamin D, Ergocalciferol, (DRISDOL) 50000 UNITS CAPS capsule    Sig: Take 1 capsule (50,000 Units total) by mouth every 7 (seven) days.    Dispense:  8 capsule    Refill:  0    Immunization History  Administered Date(s) Administered  . PPD Test 07/05/2013    Family History  Problem Relation Age of Onset  . Hyperlipidemia Mother   . Diabetes Father     History  Substance Use Topics  . Smoking status: Never Smoker   . Smokeless tobacco: Never Used  .  Alcohol Use: Yes    Review of Systems   As noted in HPI  Filed Vitals:   11/04/13 1147  BP: 123/81  Pulse: 93  Temp: 98.3 F (36.8 C)  Resp: 17    Physical Exam  Physical Exam  Constitutional: No distress.  Eyes: EOM are normal. Pupils are equal, round, and reactive to light.  Cardiovascular: Normal rate and regular rhythm.   Pulmonary/Chest: Breath sounds normal. No respiratory distress. He has no wheezes. He has no rales.  Abdominal: Soft. Bowel sounds are normal. He exhibits no distension. There is no tenderness. There is no rebound and no guarding.  Musculoskeletal: He exhibits no edema.    CBC    Component Value Date/Time   WBC 5.9 07/07/2013 0921   RBC 5.05 07/07/2013 0921   HGB 14.9 07/07/2013 0921   HCT 43.4 07/07/2013 0921   PLT 208 07/07/2013 0921   MCV 85.9 07/07/2013 0921   LYMPHSABS 2.1 07/07/2013 0921   MONOABS 0.4 07/07/2013  0921   EOSABS 0.2 07/07/2013 0921   BASOSABS 0.1 07/07/2013 0921    CMP     Component Value Date/Time   NA 138 07/07/2013 0921   K 4.3 07/07/2013 0921   CL 104 07/07/2013 0921   CO2 25 07/07/2013 0921   GLUCOSE 108* 07/07/2013 0921   BUN 13 07/07/2013 0921   CREATININE 0.77 07/07/2013 0921   CREATININE 1.06 10/07/2012 2232   CALCIUM 9.3 07/07/2013 0921   PROT 6.9 07/07/2013 0921   ALBUMIN 4.3 07/07/2013 0921   AST 32 07/07/2013 0921   ALT 50 07/07/2013 0921   ALKPHOS 72 07/07/2013 0921   BILITOT 0.6 07/07/2013 0921   GFRNONAA >89 07/07/2013 0921   GFRNONAA 87* 10/07/2012 2232   GFRAA >89 07/07/2013 0921   GFRAA >90 10/07/2012 2232    Lab Results  Component Value Date/Time   CHOL 160 08/09/2013  9:01 AM    No components found with this basename: hga1c    Lab Results  Component Value Date/Time   AST 32 07/07/2013  9:21 AM    Assessment and Plan  Family history of diabetes mellitus - Plan: Results for orders placed in visit on 11/04/13  GLUCOSE, POCT (MANUAL RESULT ENTRY)      Result Value Ref Range   POC Glucose 94  70 - 99 mg/dl  POCT GLYCOSYLATED HEMOGLOBIN (HGB A1C)      Result Value Ref Range   Hemoglobin A1C 5.7     His hemoglobin A1c is improved to 5.7% from 6.0%, I have advised patient to continue with low carbohydrate diet.  Hypertriglyceridemia - Plan: Will recheck Lipid panel, if triglycerides are trending up advised patient to restart TriCor.  Unspecified vitamin D deficiency - Plan: Patient only took this medication for 4 weeks, resume back on Vitamin D, Ergocalciferol, (DRISDOL) 50000 UNITS CAPS capsule  Hemorrhoid - Plan: Trial of hydrocortisone (ANUSOL-HC) 25 MG suppository  Unspecified constipation - Plan: polyethylene glycol powder (GLYCOLAX/MIRALAX) powder    Return in about 3 months (around 02/04/2014) for hypertriglycridemia.  Doris Cheadleeepak Tamzin Bertling, MD

## 2013-11-04 NOTE — Patient Instructions (Signed)
Gua de planeamiento de la alimentacin para diabticos (Diabetes Meal Planning Guide) La gua de planeamiento de alimentacin para diabticos es una herramienta para ayudarlo a planear sus comidas y colaciones. Es importante para las personas con diabetes controlar sus niveles de azcar. Elegir los alimentos correctos y las cantidades adecuadas durante el da le ayudar a controlar el azcar en sangre. Comer bien puede incluso ayudarlo a mejorar la presin sangunea y alcanzar o mantener un peso saludable. CUENTE LOS HIDRATOS DE CARBONO CON FACILIDAD Cuando consume hidratos de carbono, stos se transforman en azcar (glucosa). Esto a su vez aumenta el nivel de azcar en sangre. El conteo de carbohidratos puede ayudarlo a controlar este nivel para que se sienta mejor. Al planear sus alimentos con el conteo de carbohidratos, podr tener ms flexibilidad en lo que come y equilibrar la medicacin con el consumo de alimentos. El conteo de carbohidratos significa simplemente sumar la cantidad total de gramos de carbohidratos a sus comidas o colaciones. Trate de consumir la misma cantidad en cada comida. A continuacin encontrar una lista de 1 porcin o 15 gr. de carbohidratos. A continuacin se enumeran. Pregunte al mdico cuntos gramos de carbohidratos necesita comer en cada comida o colacin. Almidones y granos  1 rebanada de pan.   bollo ingls o bollo para hamburguesa o hotdog.   taza de cereal fro (sin azcar).   taza de pasta o arroz cocido.   taza de vegetales que contengan almidn (maz, papas, arvejas, porotos, calabaza).  1 omelette (6 pulgadas).   bollo.  1 waffle o panqueque (del tamao de un CD).   taza de cereales cocidos.  4 a 6 galletas saldas pequeas. *Se recomienda el consumo de granos enteros. Frutas  1 taza de frutos rojos, meln, papaya o anan sin azcar.  1 fruta fresca pequea.   banana o mango.   taza de jugo de frutas (4 onzas sin endulzar).    taza de fruta envasada en jugo natural o agua.  2 cucharadas de frutas secas.  12-15 uvas o cerezas. Leche y yogurt  1 taza de leche descremada o al 1%.  1 taza de leche de soja.  6 onzas de yogurt descremado con edulcorante sin azcar.  6 onzas de yogur descremado de soja.  6 onzas de yogur natural. Vegetales  1 taza de vegetales crudos o  de vegetales cocidos se considera cero carbohidratos o una comida "libre".  Si come 3 o ms porciones en una comida, cuntelas como 1 porcin de carbohidratos. Otros carbohidratos   onzas de chips o pretzels.   taza de helado de crema o yogur helado.   taza de helado de agua.  5 cm de torta no congelada.  1 cucharada de miel, azcar, mermelada, jalea o almbar.  2 galletitas dulces pequeas.  3 cuadrados de crackers de graham.  3 tazas de palomitas de maz.  6 crackers.  1 taza de caldo.  Cuente 1 taza de guisado u otra mezcla de alimentos como 2 porciones de carbohidratos.  Los alimentos con menos de 20 caloras por porcin deben contarse como cero carbohidratos o alimento "libre". Si lo desea compre un libro o software de computacin que enumere la cantidad de gramos de carbohidratos de los diferentes alimentos. Adems, el panel nutricional en las etiquetas de los productos que consume es una buena fuente de informacin. Le indicar el tamao de la porcin y la cantidad total de carbohidratos que consumir por cada una. Divida este nmero por 15 para obtener el nmero   de conteo de carbohidratos por porcin. Recuerde: cada porcin son 15 gramos de carbohidratos. PORCIONES La medicin de los alimentos y el tamao de las porciones lo ayudarn a controlar la cantidad exacta de comida que debe ingerir. La lista que sigue le mostrar el tamao de algunas porciones comunes.   1 onza.................4 dados apilados.  3 onzas...............Un mazo de cartas.  1 cucharadita.....La punta de un dedo pequeo.  1  cucharada........Un dedo.  2 cucharadas......Una pelota de golf.   taza...............La mitad de un puo.  1 taza................Un puo. EJEMPLO DE PLAN DE ALIMENTACIN PARA DIABTICOS: A continuacin se muestra un ejemplo de plan de alimentacin que incluye comidas de los grupos de granos y fculas, vegetales, frutas y carnes. Un nutricionista podr confeccionarle un plan individualizado para cubrir sus necesidades calricas y decirle el nmero de porciones que necesita de cada grupo. Sin embargo, podra intercambiar los alimentos que contengan carbohidratos (lcteos, cereales y frutas). Controlar la cantidad total de carbohidratos en los alimentos o colaciones es ms importante que asegurarse de incluir todos los grupos alimenticios cada vez que come.  El siguiente plan de alimentacin es un ejemplo de una dieta de 2000 caloras mediante el conteo de carbohidratos. Este plan contiene 17 porciones de carbohidratos. Desayuno  1 taza de avena (2 porciones de carbohidratos).   taza de yogur light(1 porcin de carbohidratos).  1 taza de arndanos (1 porcin de carbohidratos).   taza de almendras. Colacin  1 manzana grande (2 porciones de carbohidratos).  1 palito de queso bajo en grasa. Almuerzo  Ensalada de pechuga de pollo.  1 taza de espinacas.   taza de tomates cortados.  2 oz (60 gr) de pechuga de pollo en rebanadas.  2 cucharadas de aderezo italiano bajo en contenido graso.  12 galletas integrales (2 porciones de carbohidratos).  12 a 15 uvas (1 porcin de carbohidratos).  1 taza de leche descremada (1porcin de carbohidratos). Colacin  1 taza de zanahorias.   taza de pur de garbanzos (1 porcin de carbohidratos). Cena  3 oz (80 gr) de salmn a la parrilla.  1 taza de arroz integral (3 porciones de carbohidratos). Colacin  1  taza de brcoli al vapor (1 porcin de carbohidrato) con una cucharadita de aceite de oliva y jugo de limn.  1 taza de  budn light (2 porciones de carbohidratos). HOJA DE PLANEAMIENTO DE LA ALIMENTACIN: El dietista podr utilizar esta hoja para ayudarlo a decidir cuntas porciones y qu tipos de alimentos son los adecuados para usted.  DESAYUNO Grupo de alimentos y porciones / Alimento elegido Granos/Fculas_________________________________________________ Lcteos________________________________________________________ Vegetales ______________________________________________________ Fruta __________________________________________________________ Carnes _________________________________________________________ Grasas _________________________________________________________ ALMUERZO Grupo de alimentos y porciones / Alimento elegido Granos/Fculas___________________________________________________ Lcteos_________________________________________________________ Fruta ___________________________________________________________ Carnes __________________________________________________________ Grasas __________________________________________________________ CENA Grupo de alimentos y porciones / Alimento elegido Granos/Fculas___________________________________________________ Lcteos_________________________________________________________ Fruta ___________________________________________________________ Carnes __________________________________________________________ Grasas __________________________________________________________ COLACIN Grupo de alimentos y porciones / Alimento elegido Granos/Fculas_________________________________________________ Lcteos________________________________________________________ Vegetales ______________________________________________________ Fruta _________________________________________________________ Carnes ________________________________________________________ Grasas ________________________________________________________ TOTALES  DIARIOS Fculas_______________________________________________________ Vegetales _____________________________________________________ Fruta ________________________________________________________ Lcteos_______________________________________________________ Carnes________________________________________________________ Grasas ________________________________________________________ Document Released: 08/26/2007 Document Revised: 08/11/2011 [employer] Patient Information 2014 [employer].  

## 2013-11-07 ENCOUNTER — Telehealth: Payer: Self-pay

## 2013-11-07 NOTE — Telephone Encounter (Signed)
Interpreter line used Patient not available Message left to return our call 

## 2013-11-07 NOTE — Telephone Encounter (Signed)
Message copied by Lestine Mount on Mon Nov 07, 2013 11:15 AM ------      Message from: Doris Cheadle      Created: Mon Nov 07, 2013  9:21 AM       Blood work reviewed , call and advise patient to continue to take TriCor, his triglycerides are slightly increased compared to 3 months ago. Also advise for low fat diet. ------

## 2013-11-08 NOTE — Telephone Encounter (Signed)
Pt. Called back in regards to blood work...Marland KitchenMarland KitchenPlease call patient back.Marland KitchenMarland Kitchen

## 2013-11-09 ENCOUNTER — Telehealth: Payer: Self-pay

## 2013-11-09 NOTE — Telephone Encounter (Signed)
Interpreter line used Patient not available A message was left on voice mail to return our call

## 2014-01-30 ENCOUNTER — Ambulatory Visit: Payer: Self-pay | Admitting: Internal Medicine

## 2014-06-09 ENCOUNTER — Emergency Department (HOSPITAL_COMMUNITY): Payer: Self-pay

## 2014-06-09 ENCOUNTER — Emergency Department (HOSPITAL_COMMUNITY)
Admission: EM | Admit: 2014-06-09 | Discharge: 2014-06-09 | Disposition: A | Discharge status: Home / Self Care | Payer: Self-pay | Attending: Emergency Medicine | Admitting: Emergency Medicine

## 2014-06-09 ENCOUNTER — Encounter (HOSPITAL_COMMUNITY): Payer: Self-pay | Admitting: *Deleted

## 2014-06-09 DIAGNOSIS — J069 Acute upper respiratory infection, unspecified: Secondary | ICD-10-CM | POA: Insufficient documentation

## 2014-06-09 DIAGNOSIS — E785 Hyperlipidemia, unspecified: Secondary | ICD-10-CM | POA: Insufficient documentation

## 2014-06-09 DIAGNOSIS — Z7952 Long term (current) use of systemic steroids: Secondary | ICD-10-CM | POA: Insufficient documentation

## 2014-06-09 DIAGNOSIS — M549 Dorsalgia, unspecified: Secondary | ICD-10-CM | POA: Insufficient documentation

## 2014-06-09 DIAGNOSIS — Z7951 Long term (current) use of inhaled steroids: Secondary | ICD-10-CM | POA: Insufficient documentation

## 2014-06-09 DIAGNOSIS — R079 Chest pain, unspecified: Secondary | ICD-10-CM | POA: Insufficient documentation

## 2014-06-09 DIAGNOSIS — Z720 Tobacco use: Secondary | ICD-10-CM | POA: Insufficient documentation

## 2014-06-09 DIAGNOSIS — Z79899 Other long term (current) drug therapy: Secondary | ICD-10-CM | POA: Insufficient documentation

## 2014-06-09 LAB — BASIC METABOLIC PANEL
Anion gap: 10 (ref 5–15)
BUN: 10 mg/dL (ref 6–23)
CO2: 28 mmol/L (ref 19–32)
CREATININE: 0.79 mg/dL (ref 0.50–1.35)
Calcium: 9.3 mg/dL (ref 8.4–10.5)
Chloride: 98 mEq/L (ref 96–112)
GFR calc Af Amer: >90 mL/min (ref >90)
Glucose, Bld: 107 mg/dL — ABNORMAL HIGH (ref 70–99)
Potassium: 3.8 mmol/L (ref 3.5–5.1)
Sodium: 136 mmol/L (ref 135–145)

## 2014-06-09 LAB — I-STAT TROPONIN, ED: Troponin i, poc: 0 ng/mL (ref 0.00–0.08)

## 2014-06-09 LAB — CBC
HCT: 42.3 % (ref 39.0–52.0)
HEMOGLOBIN: 15 g/dL (ref 13.0–17.0)
MCH: 31.3 pg (ref 26.0–34.0)
MCHC: 35.5 g/dL (ref 30.0–36.0)
MCV: 88.1 fL (ref 78.0–100.0)
Platelets: 185 10*3/uL (ref 150–400)
RBC: 4.8 MIL/uL (ref 4.22–5.81)
RDW: 12.7 % (ref 11.5–15.5)
WBC: 7.8 10*3/uL (ref 4.0–10.5)

## 2014-06-09 MED ORDER — GUAIFENESIN 100 MG/5ML PO LIQD: 100.0000 mg | ORAL | Status: DC | PRN

## 2014-06-09 MED ORDER — BENZONATATE 100 MG PO CAPS: 100.0000 mg | ORAL_CAPSULE | Freq: Three times a day (TID) | ORAL | Status: DC | PRN

## 2014-06-09 NOTE — ED Notes (Signed)
Patient reports chest pain and back pain with fever for 4 days.  He has had cough as well.  Children have been sick as well.

## 2014-06-09 NOTE — Discharge Instructions (Signed)
Infeccin de las vas areas superiores en los adultos (Upper Respiratory Infection, Adult)  La infeccin respiratoria de las vas areas superiores se conoce tambin como resfro comn. Las vas areas superiores incluyen los senos nasales, la garganta, la trquea, y los bronquios. Los bronquios son las vas areas que conducen el aire a los pulmones. La mayor parte de las personas mejora luego de una semana, pero los sntomas pueden durar hasta dos semanas. La tos residual puede durar ms. CAUSAS Varios tipos de virus pueden causar la infeccin de los tejidos que cubren las vas areas superiores. Los tejidos se irritan y se inflaman y se originan secreciones. Tambin es frecuente la produccin de moco. El resfro es contagioso. El virus se disemina fcilmente a otras personas por contacto oral. Aqu se incluyen los besos, el compartir un vaso y el toser o estornudar. Tambin puede diseminarse tocndose la boca o la nariz y luego tocando una superficie que luego tocan otras personas.  SNTOMAS Los sntomas se desarrollan entre uno y tres das luego de entrar en contacto con el virus. Pueden variar de una persona a otra. Incluyen:  Secrecin nasal.  Estornudos  Congestin nasal.  Irritacin de los senos nasales.  Dolor de garganta.  Prdida de la voz (laringitis).  Tos.  Fatiga.  Dolores musculares.  Prdida del apetito.  Dolor de cabeza.  Fiebre no muy elevada. DIAGNSTICO Puede diagnosticarse a s mismo la infeccin respiratoria, segn los sntomas habituales, ya que la mayor parte de las personas se resfra dos o tres veces al ao. El profesional puede confirmarlo basndose en el examen fsico. Lo ms importante es que el profesional verifique que los sntomas no se deben a otra enfermedad como anginas, sinusitis, neumona, asma o epiglotitis. Para diagnosticar el resfrio comn, no es necesario que haga anlisis de sangre, pruebas en la garganta o radiografas, pero en algunos  casos puede ser de utilidad para excluir otros problemas ms graves. El mdico decidir si necesita otras pruebas. RIESGOS Y COMPLICACIONES Tendr mayor riesgo de sufrir un resfro grave si consume cigarrillos, sufre una enfermedad cardaca (como insuficiencia cardaca) o pulmonar crnica (como asma) o si tiene un debilitamiento del sistema inmunolgico. Las personas muy jvenes o muy mayores tienen riesgo de sufrir infecciones ms graves. La sinusitis bacteriana, las infecciones del odo medio y la neumona bacteriana pueden complicar el resfro comn. El resfro puede exacerbar el asma y la enfermedad pulmonar obstructiva crnica. En algunos casos estas complicaciones requieren la atencin en un servicio de emergencias y pueden poner en peligro la vida. PREVENCIN La mejor manera de protegerse para no contraer un resfro es mantener una buena higiene. Evite el contacto bucal o de las manos con personas con sntomas de resfro. Si se produce el contacto, lvese las manos con frecuencia. No hay pruebas firmes que indiquen que la vitamina C, la vitamina E, la equincea o la actividad fsica reduzcan las posibilidades de tener una infeccin. Sin embargo, siempre se recomienda descansar mucho y tener una buena nutricin. TRATAMIENTO El tratamiento est dirigido a aliviar los sntomas. Esta enfermedad no tiene cura. Los antibiticos no son eficaces, ya que esta infeccin la causa un virus y no una bacteria. El tratamiento incluye:  Aumente la ingesta de lquidos. Consumo de bebidas deportivas, que proporcionan electrolitos,azcares e hidratacin.  Inhale vapor caliente (de un vaporizador o de la ducha).  Tomar sopa de pollo u otros lquidos claros, y mantener una buena nutricin.  Descanse lo suficiente.  Haga grgaras o coma pastillas para aliviar   las molestias.  Control de la fiebre con ibuprofeno o acetaminofen, segn las indicaciones del mdico.  Aumento del uso del inhalador, si sufre asma. Las  pastillas y los geles de zinc durante las primeras 24 horas de iniciado el resfro comn, pueden disminuir la duracin y aliviar la gravedad de los sntomas. Los medicamentos para el dolor pueden disminuir la fiebre, aliviar los dolores musculares y el dolor de garganta. Se dispone de una gran variedad de medicamentos de venta libre para tratar la congestin y la secrecin nasal. El profesional podr recomendarle inhalantes para los otros sntomas. INSTRUCCIONES PARA EL CUIDADO DOMICILIARIO  Utilice los medicamentos de venta libre o de prescripcin para el dolor, el malestar o la fiebre, segn se lo indique el profesional que lo asiste.  Utilice un vaporizador caliente o inhale vapor, haciendo salir agua de la ducha para aumentar la humedad ambiente. Esto mantendr las secreciones hmedas y le resultar ms fcil respirar.  Beba gran cantidad de lquido para mantener la orina de tono claro o color amarillo plido.  Descanse todo lo que pueda.  Regrese a su trabajo cuando la temperatura se haya normalizado, o cuando el profesional que lo asiste se lo indique. Quizs sea necesario que permanezca en su casa durante un tiempo ms prolongado para evitar infectar a otras personas. Tambin puede utilizar un barbijo y ser cuidadoso con el lavado de manos para evitar la diseminacin del virus. SOLICITE ATENCIN MDICA SI:  Luego de los primeros das siente que empeora en vez de mejorar.  Necesita que el profesional le brinde ms informacin relacionada con los medicamentos para controlar los sntomas.  Siente escalofros, le falta el aire o escupe moco de color marrn o rojo. Estos pueden ser sntomas de neumona.  Tiene una secrecin nasal de color amarillo o marrn, o siente dolor en el rostro, especialmente cuando se inclina hacia adelante. Estos pueden ser sntomas de sinusitis.  Tiene fiebre, siente el cuello hinchado, tiene dolor al tragar u observa manchas blancas en el fondo de la garganta.  Estos pueden ser sntomas de angina por estreptococo. SOLICITE ATENCIN MDICA DE INMEDIATO SI:  Tiene fiebre.  Comienza a sentir un dolor de cabeza intenso o persistente, dolor de odos, en el seno nasal o en el pecho.  Tiene tos y esta se prolonga demasiado, tose y escupe sangre, la mucosidad habitual se modifica (si tiene una enfermedad pulmonar crnica) o respira con dificultad.  Siente rigidez en el cuello o dolor de cabeza intenso. Document Released: 02/26/2005 Document Revised: 08/11/2011 [employer] Patient Information 2015 [employer]. This information is not intended to replace advice given to you by your health care provider. Make sure you discuss any questions you have with your health care provider.  

## 2014-06-09 NOTE — ED Provider Notes (Signed)
CSN: [id number]     Arrival date & time 06/09/14  1334 History   First MD Initiated Contact with Patient 06/09/14 1622     Chief Complaint  Patient presents with  . Cough  . Chest Pain  . Back Pain  . Fever     (Consider location/radiation/quality/duration/timing/severity/associated sxs/prior Treatment) HPI   39 year old male with history of allergic rhinitis presenting to the ED for evaluation of flulike symptoms. Patient reports for the past 4 days he has gradual onset of persistent nonproductive cough, nasal congestion, chest congestion, subjective fever, body aches, that is not improved with taking over-the-counter medication. States that his children has been sick with the same cough as well. Denies any recent travel. He denies having severe headache, neck pain, sneezing, sore throat, ear pain, back pain, abdominal pain except when he coughs, vomiting or diarrhea, or rash. He is unsure of his immunization status. He worked as a Administrator and works outside often. Patient is an occasional smoker, denies any significant history of cardiac disease. He denies having any shortness of breath, dyspnea on exertion, and has no significant risk factors concerning for PE or DVT  Past Medical History  Diagnosis Date  . Hyperlipemia   . Allergic rhinitis 12/15/2012   History reviewed. No pertinent past surgical history. Family History  Problem Relation Age of Onset  . Hyperlipidemia Mother   . Diabetes Father    History  Substance Use Topics  . Smoking status: Current Some Day Smoker  . Smokeless tobacco: Never Used  . Alcohol Use: Yes    Review of Systems  All other systems reviewed and are negative.     Allergies  Review of patient's allergies indicates no known allergies.  Home Medications   Prior to Admission medications   Medication Sig Start Date End Date Taking? Authorizing Provider  cetirizine (ZYRTEC) 10 MG tablet Take 1 tablet (10 mg total) by mouth daily. 12/15/12    Clanford Cyndie Mull, MD  fenofibrate (TRICOR) 145 MG tablet Take 1 tablet (145 mg total) by mouth daily. 08/19/13   Doris Cheadle, MD  fluticasone (FLONASE) 50 MCG/ACT nasal spray Place 2 sprays into the nose daily. 12/15/12   Clanford Cyndie Mull, MD  hydrocortisone (ANUSOL-HC) 25 MG suppository Place 1 suppository (25 mg total) rectally 2 (two) times daily. 11/04/13   Doris Cheadle, MD  polyethylene glycol powder (GLYCOLAX/MIRALAX) powder Take 17 g by mouth 2 (two) times daily as needed. 11/04/13   Doris Cheadle, MD  traZODone (DESYREL) 50 MG tablet Take 1 tablet (50 mg total) by mouth at bedtime as needed for sleep. 12/15/12   Clanford Cyndie Mull, MD  Vitamin D, Ergocalciferol, (DRISDOL) 50000 UNITS CAPS capsule Take 1 capsule (50,000 Units total) by mouth every 7 (seven) days. 11/04/13   Doris Cheadle, MD   BP 119/72 mmHg  Pulse 81  Temp(Src) 98.7 F (37.1 C) (Oral)  Resp 14  SpO2 99% Physical Exam  Constitutional: He is oriented to person, place, and time. He appears well-developed and well-nourished. No distress.  HENT:  Head: Atraumatic.  Right Ear: External ear normal.  Left Ear: External ear normal.  Nose: Nose normal.  Mouth/Throat: Oropharynx is clear and moist. No oropharyngeal exudate.  Eyes: Conjunctivae are normal.  Neck: Normal range of motion. Neck supple.  No nuchal rigidity  Cardiovascular: Normal rate and regular rhythm.   Pulmonary/Chest: Effort normal and breath sounds normal. No respiratory distress. He has no wheezes. He has no rales. He exhibits no tenderness.  Abdominal:  Soft. There is no tenderness.  Musculoskeletal: He exhibits no edema.  Neurological: He is alert and oriented to person, place, and time.  Skin: No rash noted.  Psychiatric: He has a normal mood and affect.    ED Course  Procedures (including critical care time)  4:29 PM Patient with URI symptoms. He is afebrile with stable normal vital signs and no hypoxia. Symptoms likely to be viral in etiology.  Chest x-ray shows no evidence of infection. Labs are reassuring. Symptom does not support ACS. He had a normal EKG, negative troponin. Plan to treat symptoms, reassurance given. Return precautions discussed. Labs Review Labs Reviewed  BASIC METABOLIC PANEL - Abnormal; Notable for the following:    Glucose, Bld 107 (*)    All other components within normal limits  CBC  I-STAT TROPOININ, ED    Imaging Review Dg Chest 2 View  06/09/2014   CLINICAL DATA:  Chest pain and difficulty breathing; cough for 4 days  EXAM: CHEST  2 VIEW  COMPARISON:  Oct 07, 2012 and September 26, 2009  FINDINGS: There is no edema or consolidation. The heart size and pulmonary vascularity are normal. No adenopathy. No bone lesions. No pneumothorax.  IMPRESSION: No edema or consolidation.   Electronically Signed   By: Bretta BangWilliam  Woodruff M.D.   On: 06/09/2014 15:07     EKG Interpretation None      Date: 06/09/2014  Rate: 80  Rhythm: normal sinus rhythm  QRS Axis: normal  Intervals: normal  ST/T Wave abnormalities: normal  Conduction Disutrbances: none  Narrative Interpretation:   Old EKG Reviewed: none for comparison     MDM   Final diagnoses:  URI (upper respiratory infection)    BP 119/72 mmHg  Pulse 81  Temp(Src) 98.7 F (37.1 C) (Oral)  Resp 14  SpO2 99%   I have reviewed nursing notes and vital signs. I personally reviewed the imaging tests through PACS system  I reviewed available ER/hospitalization records thought the EMR   Fayrene HelperBowie Sugey Trevathan, PA-C 06/09/14 1635  Vida RollerBrian D Miller, MD 06/10/14 [phone]

## 2014-06-19 ENCOUNTER — Encounter: Payer: Self-pay | Admitting: Internal Medicine

## 2014-06-19 ENCOUNTER — Ambulatory Visit: Payer: Self-pay | Attending: Internal Medicine | Admitting: Internal Medicine

## 2014-06-19 VITALS — BP 120/80 | HR 61 | Temp 98.0°F | Resp 16 | Wt 197.0 lb

## 2014-06-19 DIAGNOSIS — Z7951 Long term (current) use of inhaled steroids: Secondary | ICD-10-CM | POA: Insufficient documentation

## 2014-06-19 DIAGNOSIS — R0981 Nasal congestion: Secondary | ICD-10-CM

## 2014-06-19 DIAGNOSIS — J069 Acute upper respiratory infection, unspecified: Secondary | ICD-10-CM

## 2014-06-19 DIAGNOSIS — E781 Pure hyperglyceridemia: Secondary | ICD-10-CM

## 2014-06-19 DIAGNOSIS — F17200 Nicotine dependence, unspecified, uncomplicated: Secondary | ICD-10-CM | POA: Insufficient documentation

## 2014-06-19 DIAGNOSIS — Z111 Encounter for screening for respiratory tuberculosis: Secondary | ICD-10-CM

## 2014-06-19 DIAGNOSIS — G4483 Primary cough headache: Secondary | ICD-10-CM

## 2014-06-19 LAB — LIPID PANEL
CHOL/HDL RATIO: 4.5 ratio
Cholesterol: 175 mg/dL (ref 0–200)
HDL: 39 mg/dL — ABNORMAL LOW (ref >39)
LDL CALC: 80 mg/dL (ref 0–99)
Triglycerides: 280 mg/dL — ABNORMAL HIGH (ref <150)
VLDL: 56 mg/dL — ABNORMAL HIGH (ref 0–40)

## 2014-06-19 MED ORDER — BUTALBITAL-APAP-CAFFEINE 50-325-40 MG PO TABS: 1.0000 | ORAL_TABLET | Freq: Four times a day (QID) | ORAL | Status: DC | PRN

## 2014-06-19 MED ORDER — FENOFIBRATE 145 MG PO TABS: 145.0000 mg | ORAL_TABLET | Freq: Every day | ORAL | Status: DC

## 2014-06-19 MED ORDER — GUAIFENESIN-CODEINE 100-10 MG/5ML PO SYRP: 5.0000 mL | ORAL_SOLUTION | Freq: Three times a day (TID) | ORAL | Status: DC | PRN

## 2014-06-19 MED ORDER — SALINE SPRAY 0.65 % NA SOLN: 1.0000 | NASAL | Status: DC | PRN

## 2014-06-19 NOTE — Progress Notes (Signed)
Interpreter line used Patient states was seen in the ED for cough and diagnosed with an infection in his lungs Three days later was feeling worse and went to a doctor on [address] to be seen again Complains of still having a cough and a headache Symptoms started on January 3rd Was prescribed levaquin as his antibiotic

## 2014-06-19 NOTE — Progress Notes (Signed)
MRN: [id number] Name: Fred Mullins  Sex: male Age: 39 y.o. DOB: 02/21/1976  Allergies: Review of patient's allergies indicates no known allergies.  Chief Complaint  Patient presents with  . Follow-up    HPI: Patient is 39 y.o. male who has symptoms of URI for the last 2 weeks, patient went to the emergency room, EMR reviewed had a chest x-ray done which was negative for pneumonia patient has persistent cough which is nonproductive as per patient he [facility] and was prescribed Levaquin which she already has finished has been using nasal spray Flonase, as per patient this excessive cough is also contributing to his symptoms of headache been taking ibuprofen, denies any numbness weakness. Patient is requesting to be checked for TB. Patient also history of hypertriglyceridemia as per patient he then out of his medication for the past few months.  Past Medical History  Diagnosis Date  . Hyperlipemia   . Allergic rhinitis 12/15/2012    History reviewed. No pertinent past surgical history.    Medication List       This list is accurate as of: 06/19/14 12:10 PM.  Always use your most recent med list.               benzonatate 100 MG capsule  Commonly known as:  TESSALON  Take 1 capsule (100 mg total) by mouth 3 (three) times daily as needed for cough.     butalbital-acetaminophen-caffeine 50-325-40 MG per tablet  Commonly known as:  ESGIC  Take 1 tablet by mouth every 6 (six) hours as needed for headache.     cetirizine 10 MG tablet  Commonly known as:  ZYRTEC  Take 1 tablet (10 mg total) by mouth daily.     fenofibrate 145 MG tablet  Commonly known as:  TRICOR  Take 1 tablet (145 mg total) by mouth daily.     fluticasone 50 MCG/ACT nasal spray  Commonly known as:  FLONASE  Place 2 sprays into the nose daily.     guaiFENesin 100 MG/5ML liquid  Commonly known as:  ROBITUSSIN  Take 5-10 mLs (100-200 mg total) by mouth every 4 (four) hours  as needed for cough.     guaiFENesin-codeine 100-10 MG/5ML syrup  Commonly known as:  CHERATUSSIN AC  Take 5 mLs by mouth 3 (three) times daily as needed for cough.     hydrocortisone 25 MG suppository  Commonly known as:  ANUSOL-HC  Place 1 suppository (25 mg total) rectally 2 (two) times daily.     polyethylene glycol powder powder  Commonly known as:  GLYCOLAX/MIRALAX  Take 17 g by mouth 2 (two) times daily as needed.     sodium chloride 0.65 % Soln nasal spray  Commonly known as:  OCEAN  Place 1 spray into both nostrils as needed for congestion.     traZODone 50 MG tablet  Commonly known as:  DESYREL  Take 1 tablet (50 mg total) by mouth at bedtime as needed for sleep.     Vitamin D (Ergocalciferol) 50000 UNITS Caps capsule  Commonly known as:  DRISDOL  Take 1 capsule (50,000 Units total) by mouth every 7 (seven) days.        Meds ordered this encounter  Medications  . guaiFENesin-codeine (CHERATUSSIN AC) 100-10 MG/5ML syrup    Sig: Take 5 mLs by mouth 3 (three) times daily as needed for cough.    Dispense:  120 mL    Refill:  0  .  fenofibrate (TRICOR) 145 MG tablet    Sig: Take 1 tablet (145 mg total) by mouth daily.    Dispense:  90 tablet    Refill:  2  . sodium chloride (OCEAN) 0.65 % SOLN nasal spray    Sig: Place 1 spray into both nostrils as needed for congestion.    Dispense:  15 mL    Refill:  0  . butalbital-acetaminophen-caffeine (ESGIC) 50-325-40 MG per tablet    Sig: Take 1 tablet by mouth every 6 (six) hours as needed for headache.    Dispense:  30 tablet    Refill:  0    Immunization History  Administered Date(s) Administered  . PPD Test 07/05/2013    Family History  Problem Relation Age of Onset  . Hyperlipidemia Mother   . Diabetes Father     History  Substance Use Topics  . Smoking status: Current Some Day Smoker  . Smokeless tobacco: Never Used  . Alcohol Use: Yes    Review of Systems   As noted in HPI  Filed Vitals:    06/19/14 1122  BP: 120/80  Pulse: 61  Temp: 98 F (36.7 C)  Resp: 16    Physical Exam  Physical Exam  Constitutional:  Patient is intermittently coughing   HENT:  Nasal congestion no sinus tenderness, both TMs visualized  not congested   Eyes: EOM are normal. Pupils are equal, round, and reactive to light.  Cardiovascular: Normal rate and regular rhythm.   Pulmonary/Chest: Breath sounds normal. He has no wheezes. He has no rales.  Musculoskeletal: He exhibits no edema.    CBC    Component Value Date/Time   WBC 7.8 06/09/2014 1422   RBC 4.80 06/09/2014 1422   HGB 15.0 06/09/2014 1422   HCT 42.3 06/09/2014 1422   PLT 185 06/09/2014 1422   MCV 88.1 06/09/2014 1422   LYMPHSABS 2.1 07/07/2013 0921   MONOABS 0.4 07/07/2013 0921   EOSABS 0.2 07/07/2013 0921   BASOSABS 0.1 07/07/2013 0921    CMP     Component Value Date/Time   NA 136 06/09/2014 1422   K 3.8 06/09/2014 1422   CL 98 06/09/2014 1422   CO2 28 06/09/2014 1422   GLUCOSE 107* 06/09/2014 1422   BUN 10 06/09/2014 1422   CREATININE 0.79 06/09/2014 1422   CREATININE 0.77 07/07/2013 0921   CALCIUM 9.3 06/09/2014 1422   PROT 6.9 07/07/2013 0921   ALBUMIN 4.3 07/07/2013 0921   AST 32 07/07/2013 0921   ALT 50 07/07/2013 0921   ALKPHOS 72 07/07/2013 0921   BILITOT 0.6 07/07/2013 0921   GFRNONAA >90 06/09/2014 1422   GFRNONAA >89 07/07/2013 0921   GFRAA >90 06/09/2014 1422   GFRAA >89 07/07/2013 0921    Lab Results  Component Value Date/Time   CHOL 167 11/04/2013 12:18 PM    No components found for: HGA1C  Lab Results  Component Value Date/Time   AST 32 07/07/2013 09:21 AM    Assessment and Plan  URI (upper respiratory infection) Likely viral infection, his chest x-ray was negative already finished a course of antibiotic.  Nasal congestion - Plan: Prescribed sodium chloride (OCEAN) 0.65 % SOLN nasal spray  Hypertriglyceridemia - Plan:will repeat  Lipid panel, Will resume back on  fenofibrate  (TRICOR) 145 MG tablet  Cough headache - Plan: guaiFENesin-codeine (CHERATUSSIN AC) 100-10 MG/5ML syrup, butalbital-acetaminophen-caffeine (ESGIC) 50-325-40 MG per tablet  PPD screening test    Return in about 3 months (around 09/18/2014) for hypertriglycridemia.  Myeasha Ballowe,  Vernon Prey, MD

## 2014-06-20 ENCOUNTER — Telehealth: Payer: Self-pay | Admitting: Internal Medicine

## 2014-06-20 ENCOUNTER — Telehealth: Payer: Self-pay | Admitting: *Deleted

## 2014-06-20 NOTE — Telephone Encounter (Signed)
Lab results given. Lab were printed and given to pt

## 2014-06-20 NOTE — Telephone Encounter (Signed)
Left voice message to return call (voice message in Spanish)

## 2014-06-20 NOTE — Telephone Encounter (Signed)
Patient called nurse back, please f/u with pt. °

## 2014-06-20 NOTE — Telephone Encounter (Signed)
-----   Message from Doris Cheadleeepak Advani, MD sent at 06/20/2014 11:37 AM EST ----- Call and let the patient know that his triglycerides level has elevated, advise patient for low fat diet and counsel  patient to take the medication TriCor everyday.

## 2014-06-21 ENCOUNTER — Ambulatory Visit (HOSPITAL_COMMUNITY)
Admission: RE | Admit: 2014-06-21 | Discharge: 2014-06-21 | Disposition: A | Discharge status: Home / Self Care | Payer: Self-pay | Source: Ambulatory Visit | Attending: Internal Medicine | Admitting: Internal Medicine

## 2014-06-21 ENCOUNTER — Ambulatory Visit: Payer: Self-pay | Attending: Internal Medicine

## 2014-06-21 DIAGNOSIS — R05 Cough: Secondary | ICD-10-CM | POA: Insufficient documentation

## 2014-06-21 DIAGNOSIS — R7611 Nonspecific reaction to tuberculin skin test without active tuberculosis: Secondary | ICD-10-CM | POA: Insufficient documentation

## 2014-06-21 LAB — TB SKIN TEST: TB Skin Test: POSITIVE

## 2014-06-21 NOTE — Progress Notes (Unsigned)
Patient is here to have his tb read Patient arm is positive reading at 15ml induration Order for chest xray placed in epic Interpreter used to explain to patient why we need a chest xray

## 2014-06-22 ENCOUNTER — Telehealth: Payer: Self-pay | Admitting: *Deleted

## 2014-06-22 NOTE — Telephone Encounter (Signed)
Pt call requesting Xray results

## 2014-06-26 NOTE — Telephone Encounter (Signed)
Pt aware of Xray results 

## 2014-06-26 NOTE — Telephone Encounter (Signed)
Call and let the patient know that his x-ray reported   IMPRESSION: Mild bronchitic changes.  No acute abnormalities.  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 Put in the referral.

## 2014-09-08 ENCOUNTER — Other Ambulatory Visit: Payer: Self-pay | Admitting: Family Medicine

## 2014-12-07 ENCOUNTER — Ambulatory Visit: Payer: Self-pay | Admitting: Internal Medicine

## 2014-12-08 ENCOUNTER — Ambulatory Visit: Payer: Self-pay | Admitting: Internal Medicine

## 2015-10-21 ENCOUNTER — Encounter (HOSPITAL_COMMUNITY): Payer: Self-pay | Admitting: Family Medicine

## 2015-10-21 ENCOUNTER — Emergency Department (HOSPITAL_COMMUNITY): Payer: Self-pay

## 2015-10-21 ENCOUNTER — Emergency Department (HOSPITAL_COMMUNITY)
Admission: EM | Admit: 2015-10-21 | Discharge: 2015-10-21 | Disposition: A | Discharge status: Home / Self Care | Payer: Self-pay | Attending: Emergency Medicine | Admitting: Emergency Medicine

## 2015-10-21 DIAGNOSIS — S82892A Other fracture of left lower leg, initial encounter for closed fracture: Secondary | ICD-10-CM

## 2015-10-21 DIAGNOSIS — Y9289 Other specified places as the place of occurrence of the external cause: Secondary | ICD-10-CM | POA: Insufficient documentation

## 2015-10-21 DIAGNOSIS — F17200 Nicotine dependence, unspecified, uncomplicated: Secondary | ICD-10-CM | POA: Insufficient documentation

## 2015-10-21 DIAGNOSIS — S8262XA Displaced fracture of lateral malleolus of left fibula, initial encounter for closed fracture: Secondary | ICD-10-CM | POA: Insufficient documentation

## 2015-10-21 DIAGNOSIS — Y998 Other external cause status: Secondary | ICD-10-CM | POA: Insufficient documentation

## 2015-10-21 DIAGNOSIS — Z79899 Other long term (current) drug therapy: Secondary | ICD-10-CM | POA: Insufficient documentation

## 2015-10-21 DIAGNOSIS — E785 Hyperlipidemia, unspecified: Secondary | ICD-10-CM | POA: Insufficient documentation

## 2015-10-21 DIAGNOSIS — Z7952 Long term (current) use of systemic steroids: Secondary | ICD-10-CM | POA: Insufficient documentation

## 2015-10-21 DIAGNOSIS — X501XXA Overexertion from prolonged static or awkward postures, initial encounter: Secondary | ICD-10-CM | POA: Insufficient documentation

## 2015-10-21 DIAGNOSIS — Z7951 Long term (current) use of inhaled steroids: Secondary | ICD-10-CM | POA: Insufficient documentation

## 2015-10-21 DIAGNOSIS — Y9389 Activity, other specified: Secondary | ICD-10-CM | POA: Insufficient documentation

## 2015-10-21 MED ORDER — OXYCODONE-ACETAMINOPHEN 5-325 MG PO TABS: 2.0000 | ORAL_TABLET | Freq: Four times a day (QID) | ORAL | Status: DC | PRN

## 2015-10-21 MED ORDER — OXYCODONE-ACETAMINOPHEN 5-325 MG PO TABS
2.0000 | ORAL_TABLET | Freq: Once | ORAL | Status: AC
  Administered 2015-10-21: 2 via ORAL
  Filled 2015-10-21: qty 2

## 2015-10-21 NOTE — ED Notes (Signed)
Pt here for left ankle pain and swelling. sts rolled ankle yesterday. Sig swelling.

## 2015-10-21 NOTE — ED Notes (Signed)
Declined W/C at D/C and was escorted to lobby by RN. 

## 2015-10-21 NOTE — Discharge Instructions (Signed)
Mr. Mare LoanGabino Purkey,  Nice meeting you! Please follow-up with Dr. Lajoyce Cornersuda. Return to the emergency department if you develop increased pain, swelling, discolorations in your legs, numbness/tingling in your leg. Feel better soon!  S. Lane HackerNicole Tahsin Benyo, PA-C Cuidados del yeso o la frula (Cast or Splint Care) El yeso y las frulas sostienen los miembros lesionados y evitan que los huesos se muevan hasta que se curen. Es importante que cuide el yeso o la frula cuando se encuentre en su casa.  INSTRUCCIONES PARA EL CUIDADO EN EL HOGAR  Mantenga el yeso o la frula al descubierto durante el tiempo de secado. Puede tardar Eusebio Meentre 24 y 48 horas para secarse si est hecho de yeso. La fibra de vidrio se seca en menos de 1 hora.  No apoye el yeso sobre nada que sea ms duro que una almohada durante 24 horas.  No aplique peso sobre el miembro lesionado ni haga presin sobre el yeso hasta que el mdico lo autorice.  Mantenga el yeso o la frula secos. Al mojarse pueden perder la forma y podra ocurrir que no soporten el As[place]miembro. Un yeso mojado que ha perdido su forma puede presionar de Wellsite geologistmanera peligrosa en la piel al secarse. Adems, la piel mojada podra infectarse.  Cubra el yeso o la frula con una bolsa plstica cuando tome un bao o cuando salga al exterior en das de lluvia o nieve. Si el yeso est colocado sobre el tronco, deber baarse pasando una esponja por el cuerpo, hasta que se lo retiren.  Si el yeso se moja, squelo con una toalla o con un secador de cabello slo en posicin de aire fro.  Mantenga el yeso o la frula limpios. Si el yeso se ensucia, puede limpiarlo con un pao hmedo.  No coloque objetos extraos duros o blandos debajo del yeso o cabestrillo, como algodn, papel higinico, locin o talco.  No se rasque la piel por debajo del molde con ningn objeto. Podra quedar adherido al yeso. Adems, el rascado puede causar una infeccin. Si siente picazn, use un secador de cabello con  aire fro Intelsobre la zona que pica para Altria Groupaliviar las molestias.  No recorte ni quite el relleno acolchado que se encuentra debajo del yeso.  Ejercite todas las articulaciones que no estn inmovilizadas por el yeso o frula. Por ejemplo, si tiene un yeso largo de pierna, ejercite la articulacin de la cadera y los dedos de los pies. Si tiene un brazo Harley-Davidsonenyesado o entablillado, ejercite el hombro, el codo, el pulgar y los dedos de la Cerrillos Hoyosmano.  Eleve el brazo o la pierna sobre 1  2 almohadas durante los primeros 3 das para disminuir la hinchazn y Chief Technology Officerel dolor.Es mejor si puede elevar cmodamente el yeso para que quede ms Seychellesarriba del nivel del corazn. SOLICITE ATENCIN MDICA SI:   El yeso o la frula se quiebran.  Siente que el yeso o la frula estn muy apretados o muy flojos.  Tiene una picazn insoportable debajo del yeso.  El yeso se moja o tiene una zona blanda.  Siente un feo Thrivent Financialolor que proviene del interior del [place]o.  Algn objeto se queda atascado bajo el yeso.  La piel que rodea el yeso enrojece o se vuelve sensible.  Siente un dolor nuevo o el dolor que senta empeora luego de la aplicacin del yeso. SOLICITE ATENCIN MDICA DE INMEDIATO SI:   Observa un lquido que sale por el yeso.  No puede mover el dedo lesionado.  Los dedos le Kuwaitcambian de  color (blancos o azules), siente fro, dolor o por fuera del yeso los dedos estn muy inflamados.  Siente hormigueo o adormecimiento alrededor de la zona de la lesin.  Siente un dolor o presin intensos debajo del yeso.  Presenta dificultad para respirar o Company secretary.  Siente dolor en el pecho.   Esta informacin no tiene Theme park manager el consejo del mdico. Asegrese de hacerle al mdico cualquier pregunta que tenga.   Document Released: 05/19/2005 Document Revised: 03/09/2013 Elsevier Interactive Patient Education [employer]

## 2015-10-21 NOTE — ED Provider Notes (Signed)
CSN: [id number]     Arrival date & time 10/21/15  1256 History   First MD Initiated Contact with Patient 10/21/15 1348     Chief Complaint  Patient presents with  . Ankle Injury   HPI  Fred Mullins is a 40 y.o. male PMH significant for HLD presenting with left ankle pain and swelling since Friday. He states he was getting out of the car and rolled his ankle. Since Friday, he has had increased swelling and pain. He has been able to bear weight. He denies fevers, chills, chest pain, shortness of breath, abdominal pain, nausea, vomiting, discolorations, numbness/tingling.  Past Medical History  Diagnosis Date  . Hyperlipemia   . Allergic rhinitis 12/15/2012   History reviewed. No pertinent past surgical history. Family History  Problem Relation Age of Onset  . Hyperlipidemia Mother   . Diabetes Father    Social History  Substance Use Topics  . Smoking status: Current Some Day Smoker  . Smokeless tobacco: Never Used  . Alcohol Use: Yes    Review of Systems  Ten systems are reviewed and are negative for acute change except as noted in the HPI  Allergies  Review of patient's allergies indicates no known allergies.  Home Medications   Prior to Admission medications   Medication Sig Start Date End Date Taking? Authorizing Provider  benzonatate (TESSALON) 100 MG capsule Take 1 capsule (100 mg total) by mouth 3 (three) times daily as needed for cough. 06/09/14   Fayrene Helper, PA-C  butalbital-acetaminophen-caffeine (ESGIC) [phone] MG per tablet Take 1 tablet by mouth every 6 (six) hours as needed for headache. 06/19/14   Doris Cheadle, MD  cetirizine (ZYRTEC) 10 MG tablet Take 1 tablet (10 mg total) by mouth daily. 12/15/12   Clanford Cyndie Mull, MD  fenofibrate (TRICOR) 145 MG tablet Take 1 tablet (145 mg total) by mouth daily. 06/19/14   Doris Cheadle, MD  fluticasone (FLONASE) 50 MCG/ACT nasal spray INSTILL 2 SPRAYS INT0 EACH NOSTRIL ONCE DAILY 09/08/14   Doris Cheadle, MD  guaiFENesin  (ROBITUSSIN) 100 MG/5ML liquid Take 5-10 mLs (100-200 mg total) by mouth every 4 (four) hours as needed for cough. 06/09/14   Fayrene Helper, PA-C  guaiFENesin-codeine (CHERATUSSIN AC) 100-10 MG/5ML syrup Take 5 mLs by mouth 3 (three) times daily as needed for cough. 06/19/14   Doris Cheadle, MD  hydrocortisone (ANUSOL-HC) 25 MG suppository Place 1 suppository (25 mg total) rectally 2 (two) times daily. 11/04/13   Doris Cheadle, MD  polyethylene glycol powder (GLYCOLAX/MIRALAX) powder Take 17 g by mouth 2 (two) times daily as needed. 11/04/13   Doris Cheadle, MD  sodium chloride (OCEAN) 0.65 % SOLN nasal spray Place 1 spray into both nostrils as needed for congestion. 06/19/14   Doris Cheadle, MD  traZODone (DESYREL) 50 MG tablet Take 1 tablet (50 mg total) by mouth at bedtime as needed for sleep. 12/15/12   Clanford Cyndie Mull, MD  Vitamin D, Ergocalciferol, (DRISDOL) 50000 UNITS CAPS capsule Take 1 capsule (50,000 Units total) by mouth every 7 (seven) days. 11/04/13   Doris Cheadle, MD   BP 120/87 mmHg  Pulse 83  Temp(Src) 98.8 F (37.1 C) (Oral)  Resp 18  SpO2 99% Physical Exam  Constitutional: He appears well-developed and well-nourished. No distress.  HENT:  Head: Normocephalic and atraumatic.  Mouth/Throat: Oropharynx is clear and moist. No oropharyngeal exudate.  Eyes: Conjunctivae are normal. Pupils are equal, round, and reactive to light. Right eye exhibits no discharge. Left eye exhibits no discharge.  No scleral icterus.  Neck: No tracheal deviation present.  Cardiovascular: Normal rate, regular rhythm and intact distal pulses.   Pulmonary/Chest: Effort normal and breath sounds normal. No respiratory distress.  Abdominal: Soft. Bowel sounds are normal. He exhibits no distension.  Musculoskeletal: Normal range of motion. He exhibits tenderness. He exhibits no edema.  Left lateral ankle tenderness. Neurovascularly intact bilaterally.  Lymphadenopathy:    He has no cervical adenopathy.   Neurological: He is alert. Coordination normal.  Skin: Skin is warm and dry. No rash noted. He is not diaphoretic. No erythema.  Psychiatric: He has a normal mood and affect. His behavior is normal.  Nursing note and vitals reviewed.   ED Course  Procedures  Imaging Review Dg Ankle Complete Left  10/21/2015  CLINICAL DATA:  Left ankle twisting injury 2 days prior with persistent pain EXAM: LEFT ANKLE COMPLETE - 3+ VIEW COMPARISON:  None. FINDINGS: Prominent lateral left ankle soft tissue swelling. Oblique minimally comminuted intra-articular lateral malleolus fracture with 3 mm lateral displacement of the distal fracture fragment. Slight widening of the medial mortise with minimal 2-3 mm lateral subluxation of the talus relative to the tibial plafond. No focal osseous lesions. IMPRESSION: 1. Oblique minimally comminuted and mildly displaced lateral malleolus fracture. 2. Slight widening of the medial left ankle mortise with minimal 2-3 mm lateral subluxation of the talus relative to the tibial plafond. Electronically Signed   By: Delbert PhenixJason A Poff M.D.   On: 10/21/2015 14:23   I have personally reviewed and evaluated these images and lab results as part of my medical decision-making.  MDM   Final diagnoses:  Ankle fracture, left, closed, initial encounter   Patient X-Ray with oblique minimally comminuted and mildly displaced lateral malleolus fracture; slight widening of the medial left ankle mortise with minimal 2-3 mm lateral subluxation of the talus relative to the tibial plafond. Pain managed in ED. Pt advised to follow up with orthopedics for further evaluation and treatment.  Patient given percocet while in ED, conservative therapy recommended and discussed. Patient will be dc home with crutches and stirrup splint & is agreeable with above plan. I have also discussed reasons to return immediately to the ER.  Patient expresses understanding and agrees with plan.  Discussed case with Dr.  Criss AlvineGoldston who agrees with above plan.     Melton KrebsSamantha Nicole Nicci Vaughan, PA-C 10/21/15 1542  Pricilla LovelessScott Goldston, MD 10/22/15 [phone]

## 2015-10-21 NOTE — Progress Notes (Signed)
Orthopedic Tech Progress Note Patient Details:  Fred Mullins 04/13/1976 [id number]  Ortho Devices Type of Ortho Device: Ace wrap, Stirrup splint, Post (short leg) splint Ortho Device/Splint Interventions: Application   [name] 10/21/2015, 3:24 PM

## 2015-10-23 ENCOUNTER — Other Ambulatory Visit (HOSPITAL_COMMUNITY): Payer: Self-pay | Admitting: Family

## 2015-10-25 ENCOUNTER — Encounter (HOSPITAL_COMMUNITY): Payer: Self-pay | Admitting: *Deleted

## 2015-10-25 MED ORDER — CEFAZOLIN SODIUM-DEXTROSE 2-4 GM/100ML-% IV SOLN
2.0000 g | INTRAVENOUS | Status: AC
  Administered 2015-10-26: 2 g via INTRAVENOUS
  Filled 2015-10-25: qty 100

## 2015-10-25 NOTE — Progress Notes (Signed)
Via [employer] Interpreter, [place] # [phone] pt denies cardiac history, chest pain or sob.  Pt states he was told he did not have TB a year ago, denies any recent cough or night sweats.

## 2015-10-26 ENCOUNTER — Ambulatory Visit (HOSPITAL_COMMUNITY): Payer: MEDICAID | Admitting: Emergency Medicine

## 2015-10-26 ENCOUNTER — Encounter (HOSPITAL_COMMUNITY): Payer: Self-pay | Admitting: Anesthesiology

## 2015-10-26 ENCOUNTER — Ambulatory Visit (HOSPITAL_COMMUNITY)
Admission: RE | Admit: 2015-10-26 | Discharge: 2015-10-26 | Disposition: A | Discharge status: Home / Self Care | Payer: Self-pay | Source: Ambulatory Visit | Attending: Orthopedic Surgery | Admitting: Orthopedic Surgery

## 2015-10-26 ENCOUNTER — Encounter (HOSPITAL_COMMUNITY)
Admission: RE | Disposition: A | Discharge status: Home / Self Care | Payer: Self-pay | Source: Ambulatory Visit | Attending: Orthopedic Surgery

## 2015-10-26 DIAGNOSIS — F17210 Nicotine dependence, cigarettes, uncomplicated: Secondary | ICD-10-CM | POA: Insufficient documentation

## 2015-10-26 DIAGNOSIS — Z7951 Long term (current) use of inhaled steroids: Secondary | ICD-10-CM | POA: Insufficient documentation

## 2015-10-26 DIAGNOSIS — S82402A Unspecified fracture of shaft of left fibula, initial encounter for closed fracture: Secondary | ICD-10-CM | POA: Insufficient documentation

## 2015-10-26 DIAGNOSIS — X58XXXA Exposure to other specified factors, initial encounter: Secondary | ICD-10-CM | POA: Insufficient documentation

## 2015-10-26 DIAGNOSIS — S82892A Other fracture of left lower leg, initial encounter for closed fracture: Secondary | ICD-10-CM

## 2015-10-26 DIAGNOSIS — J309 Allergic rhinitis, unspecified: Secondary | ICD-10-CM | POA: Insufficient documentation

## 2015-10-26 HISTORY — DX: Umbilical hernia without obstruction or gangrene: K42.9

## 2015-10-26 HISTORY — DX: Calculus of kidney: N20.0

## 2015-10-26 HISTORY — PX: ORIF ANKLE FRACTURE: SHX5408

## 2015-10-26 HISTORY — DX: Asymptomatic varicose veins of unspecified lower extremity: I83.90

## 2015-10-26 LAB — BASIC METABOLIC PANEL
Anion gap: 9 (ref 5–15)
BUN: 15 mg/dL (ref 6–20)
CHLORIDE: 103 mmol/L (ref 101–111)
CO2: 26 mmol/L (ref 22–32)
Calcium: 9.7 mg/dL (ref 8.9–10.3)
Creatinine, Ser: 0.75 mg/dL (ref 0.61–1.24)
GFR calc non Af Amer: >60 mL/min (ref >60)
GLUCOSE: 105 mg/dL — AB (ref 65–99)
POTASSIUM: 4.1 mmol/L (ref 3.5–5.1)
Sodium: 138 mmol/L (ref 135–145)

## 2015-10-26 LAB — CBC
HEMATOCRIT: 45.3 % (ref 39.0–52.0)
Hemoglobin: 15.3 g/dL (ref 13.0–17.0)
MCH: 29.3 pg (ref 26.0–34.0)
MCHC: 33.8 g/dL (ref 30.0–36.0)
MCV: 86.8 fL (ref 78.0–100.0)
Platelets: 214 10*3/uL (ref 150–400)
RBC: 5.22 MIL/uL (ref 4.22–5.81)
RDW: 12.8 % (ref 11.5–15.5)
WBC: 7.7 10*3/uL (ref 4.0–10.5)

## 2015-10-26 SURGERY — OPEN REDUCTION INTERNAL FIXATION (ORIF) ANKLE FRACTURE
Anesthesia: General | Laterality: Left

## 2015-10-26 MED ORDER — MEPERIDINE HCL 25 MG/ML IJ SOLN: 6.2500 mg | INTRAMUSCULAR | Status: DC | PRN

## 2015-10-26 MED ORDER — CHLORHEXIDINE GLUCONATE 4 % EX LIQD: 60.0000 mL | Freq: Once | CUTANEOUS | Status: DC

## 2015-10-26 MED ORDER — OXYCODONE-ACETAMINOPHEN 5-325 MG PO TABS: 1.0000 | ORAL_TABLET | ORAL | Status: DC | PRN

## 2015-10-26 MED ORDER — MIDAZOLAM HCL 2 MG/2ML IJ SOLN
INTRAMUSCULAR | Status: AC
  Filled 2015-10-26: qty 2

## 2015-10-26 MED ORDER — FENTANYL CITRATE (PF) 100 MCG/2ML IJ SOLN
INTRAMUSCULAR | Status: AC
  Administered 2015-10-26: 50 ug via INTRAVENOUS
  Filled 2015-10-26: qty 2

## 2015-10-26 MED ORDER — ONDANSETRON HCL 4 MG/2ML IJ SOLN
INTRAMUSCULAR | Status: DC | PRN
Start: 2015-10-26 — End: 2015-10-26
  Administered 2015-10-26: 4 mg via INTRAVENOUS

## 2015-10-26 MED ORDER — FENTANYL CITRATE (PF) 100 MCG/2ML IJ SOLN
INTRAMUSCULAR | Status: DC | PRN
  Administered 2015-10-26 (×5): 50 ug via INTRAVENOUS

## 2015-10-26 MED ORDER — 0.9 % SODIUM CHLORIDE (POUR BTL) OPTIME
TOPICAL | Status: DC | PRN
  Administered 2015-10-26: 1000 mL

## 2015-10-26 MED ORDER — FENTANYL CITRATE (PF) 100 MCG/2ML IJ SOLN
25.0000 ug | INTRAMUSCULAR | Status: DC | PRN
  Administered 2015-10-26 (×3): 50 ug via INTRAVENOUS

## 2015-10-26 MED ORDER — PROPOFOL 10 MG/ML IV BOLUS
INTRAVENOUS | Status: DC | PRN
  Administered 2015-10-26: 180 mg via INTRAVENOUS

## 2015-10-26 MED ORDER — LACTATED RINGERS IV SOLN
INTRAVENOUS | Status: DC
Start: 2015-10-26 — End: 2015-10-26
  Administered 2015-10-26: 09:00:00 via INTRAVENOUS

## 2015-10-26 MED ORDER — PROPOFOL 10 MG/ML IV BOLUS
INTRAVENOUS | Status: AC
  Filled 2015-10-26: qty 40

## 2015-10-26 MED ORDER — ONDANSETRON HCL 4 MG/2ML IJ SOLN: 4.0000 mg | Freq: Once | INTRAMUSCULAR | Status: DC | PRN

## 2015-10-26 MED ORDER — LIDOCAINE HCL (CARDIAC) 20 MG/ML IV SOLN
INTRAVENOUS | Status: DC | PRN
  Administered 2015-10-26: 80 mg via INTRAVENOUS

## 2015-10-26 MED ORDER — FENTANYL CITRATE (PF) 250 MCG/5ML IJ SOLN
INTRAMUSCULAR | Status: AC
  Filled 2015-10-26: qty 5

## 2015-10-26 MED ORDER — ARTIFICIAL TEARS OP OINT
TOPICAL_OINTMENT | OPHTHALMIC | Status: AC
  Filled 2015-10-26: qty 3.5

## 2015-10-26 MED ORDER — LIDOCAINE 2% (20 MG/ML) 5 ML SYRINGE
INTRAMUSCULAR | Status: AC
  Filled 2015-10-26: qty 10

## 2015-10-26 MED ORDER — ONDANSETRON HCL 4 MG/2ML IJ SOLN
INTRAMUSCULAR | Status: AC
  Filled 2015-10-26: qty 2

## 2015-10-26 MED ORDER — MIDAZOLAM HCL 5 MG/5ML IJ SOLN
INTRAMUSCULAR | Status: DC | PRN
  Administered 2015-10-26: 2 mg via INTRAVENOUS

## 2015-10-26 SURGICAL SUPPLY — 52 items
BANDAGE ESMARK 6X9 LF (GAUZE/BANDAGES/DRESSINGS) IMPLANT
BIT DRILL 2.5X110 QC LCP DISP (BIT) ×2 IMPLANT
BNDG CMPR 9X6 STRL LF SNTH (GAUZE/BANDAGES/DRESSINGS)
BNDG COHESIVE 4X5 TAN STRL (GAUZE/BANDAGES/DRESSINGS) ×2 IMPLANT
BNDG ESMARK 6X9 LF (GAUZE/BANDAGES/DRESSINGS)
BNDG GAUZE ELAST 4 BULKY (GAUZE/BANDAGES/DRESSINGS) ×2 IMPLANT
COVER SURGICAL LIGHT HANDLE (MISCELLANEOUS) ×2 IMPLANT
CUFF TOURNIQUET SINGLE 34IN LL (TOURNIQUET CUFF) IMPLANT
CUFF TOURNIQUET SINGLE 44IN (TOURNIQUET CUFF) IMPLANT
DRAPE INCISE IOBAN 66X45 STRL (DRAPES) IMPLANT
DRAPE OEC MINIVIEW 54X84 (DRAPES) ×2 IMPLANT
DRAPE PROXIMA HALF (DRAPES) ×2 IMPLANT
DRAPE U-SHAPE 47X51 STRL (DRAPES) ×2 IMPLANT
DRSG ADAPTIC 3X8 NADH LF (GAUZE/BANDAGES/DRESSINGS) ×2 IMPLANT
DRSG PAD ABDOMINAL 8X10 ST (GAUZE/BANDAGES/DRESSINGS) ×2 IMPLANT
DURAPREP 26ML APPLICATOR (WOUND CARE) ×2 IMPLANT
ELECT REM PT RETURN 9FT ADLT (ELECTROSURGICAL) ×2
ELECTRODE REM PT RTRN 9FT ADLT (ELECTROSURGICAL) ×1 IMPLANT
GAUZE SPONGE 4X4 12PLY STRL (GAUZE/BANDAGES/DRESSINGS) ×2 IMPLANT
GLOVE BIOGEL PI IND STRL 9 (GLOVE) ×1 IMPLANT
GLOVE BIOGEL PI INDICATOR 9 (GLOVE) ×1
GLOVE SURG ORTHO 9.0 STRL STRW (GLOVE) ×2 IMPLANT
GOWN STRL REUS W/ TWL XL LVL3 (GOWN DISPOSABLE) ×3 IMPLANT
GOWN STRL REUS W/TWL XL LVL3 (GOWN DISPOSABLE) ×6
KIT 1/3 TUB PL 7H 85M (Orthopedic Implant) ×1 IMPLANT
KIT BASIN OR (CUSTOM PROCEDURE TRAY) ×2 IMPLANT
KIT ROOM TURNOVER OR (KITS) ×2 IMPLANT
MANIFOLD NEPTUNE II (INSTRUMENTS) ×2 IMPLANT
NS IRRIG 1000ML POUR BTL (IV SOLUTION) ×2 IMPLANT
PACK ORTHO EXTREMITY (CUSTOM PROCEDURE TRAY) ×2 IMPLANT
PAD ARMBOARD 7.5X6 YLW CONV (MISCELLANEOUS) ×4 IMPLANT
PROS 1/3 TUB PL 7H 85M (Orthopedic Implant) ×2 IMPLANT
SCREW CORTEX 3.5 12MM (Screw) ×2 IMPLANT
SCREW CORTEX 3.5 16MM (Screw) ×1 IMPLANT
SCREW CORTEX 3.5 20MM (Screw) ×1 IMPLANT
SCREW LOCK CORT ST 3.5X12 (Screw) ×2 IMPLANT
SCREW LOCK CORT ST 3.5X16 (Screw) ×1 IMPLANT
SCREW LOCK CORT ST 3.5X20 (Screw) ×1 IMPLANT
SCREW LOCK T15 FT 14X3.5X2.9X (Screw) ×1 IMPLANT
SCREW LOCK T15 FT 16X3.5X2.9X (Screw) ×1 IMPLANT
SCREW LOCKING 3.5X14 (Screw) ×2 IMPLANT
SCREW LOCKING 3.5X16 (Screw) ×2 IMPLANT
SPONGE LAP 18X18 X RAY DECT (DISPOSABLE) ×2 IMPLANT
STAPLER VISISTAT 35W (STAPLE) IMPLANT
SUCTION FRAZIER HANDLE 10FR (MISCELLANEOUS) ×1
SUCTION TUBE FRAZIER 10FR DISP (MISCELLANEOUS) ×1 IMPLANT
SUT ETHILON 2 0 PSLX (SUTURE) IMPLANT
SUT VIC AB 2-0 CT1 27 (SUTURE) ×4
SUT VIC AB 2-0 CT1 TAPERPNT 27 (SUTURE) ×2 IMPLANT
TOWEL OR 17X24 6PK STRL BLUE (TOWEL DISPOSABLE) ×2 IMPLANT
TOWEL OR 17X26 10 PK STRL BLUE (TOWEL DISPOSABLE) ×2 IMPLANT
TUBE CONNECTING 12X1/4 (SUCTIONS) ×2 IMPLANT

## 2015-10-26 NOTE — H&P (Signed)
Fred Mullins is an 40 y.o. male.   Chief Complaint: Displaced left ankle fracture HPI: Patient is a 40 year old gentleman who sustained a supination external rotation injury to his left ankle sustaining a Weber B tibial fracture. The ankle is displaced and presents at this time for open reduction internal fixation.  Past Medical History  Diagnosis Date  . Hyperlipemia   . Allergic rhinitis 12/15/2012  . Varicose veins   . Kidney stones   . Umbilical hernia     Past Surgical History  Procedure Laterality Date  . Foot surgery      cyst removed from foot as a child    Family History  Problem Relation Age of Onset  . Hyperlipidemia Mother   . Diabetes Father    Social History:  reports that he has been smoking Cigarettes.  He has never used smokeless tobacco. He reports that he drinks about 12.0 oz of alcohol per week. He reports that he does not use illicit drugs.  Allergies: No Known Allergies  No prescriptions prior to admission    No results found for this or any previous visit (from the past 48 hour(s)). No results found.  Review of Systems  All other systems reviewed and are negative.   There were no vitals taken for this visit. Physical Exam  On examination patient has good pulses there is no fracture blisters. The skin is intact. Radiograph shows a displaced Weber B fibular fracture on the left Assessment/Plan Assessment: Displaced Weber B fibular fracture left ankle.  Plan: We'll plan for open reduction internal fixation of the fibula. Risk and benefits were discussed including infection neurovascular injury persistent pain DVT need for additional surgery. Patient states he understands wishes to proceed at this time.  Nadara MustardUDA,MARCUS V, MD 10/26/2015, 6:20 AM

## 2015-10-26 NOTE — Anesthesia Procedure Notes (Signed)
Procedure Name: LMA Insertion Date/Time: 10/26/2015 11:07 AM Performed by: Margaree MackintoshYACOUB, Sandee Bernath B Pre-anesthesia Checklist: Patient identified, Emergency Drugs available, Suction available, Patient being monitored and Timeout performed Patient Re-evaluated:Patient Re-evaluated prior to inductionOxygen Delivery Method: Circle system utilized Preoxygenation: Pre-oxygenation with 100% oxygen Intubation Type: IV induction LMA: LMA inserted LMA Size: 5.0 Number of attempts: 1 Placement Confirmation: positive ETCO2 and breath sounds checked- equal and bilateral Tube secured with: Tape Dental Injury: Teeth and Oropharynx as per pre-operative assessment

## 2015-10-26 NOTE — Progress Notes (Signed)
Orthopedic Tech Progress Note Patient Details:  Fred Mullins 09/24/1975 [id number]  Ortho Devices Type of Ortho Device: CAM walker Ortho Device/Splint Interventions: Application   Fred Mullins 10/26/2015, 12:43 PM

## 2015-10-26 NOTE — Anesthesia Preprocedure Evaluation (Addendum)
Anesthesia Evaluation  Patient identified by MRN, date of birth, ID band Patient awake    Reviewed: Allergy & Precautions, NPO status , Patient's Chart, lab work & pertinent test results  Airway Mallampati: III  TM Distance: >3 FB Neck ROM: Full    Dental  (+) Caps   Pulmonary Current Smoker,    Pulmonary exam normal breath sounds clear to auscultation       Cardiovascular negative cardio ROS Normal cardiovascular exam Rhythm:Regular Rate:Normal     Neuro/Psych  Headaches, negative psych ROS   GI/Hepatic negative GI ROS, Neg liver ROS,   Endo/Other    Renal/GU Renal diseaseHx/o Renal calculi  negative genitourinary   Musculoskeletal Left fibula Fx   Abdominal   Peds  Hematology negative hematology ROS (+)   Anesthesia Other Findings   Reproductive/Obstetrics                            Anesthesia Physical Anesthesia Plan  ASA: II  Anesthesia Plan: General   Post-op Pain Management:    Induction: Intravenous  Airway Management Planned: LMA  Additional Equipment:   Intra-op Plan:   Post-operative Plan: Extubation in OR  Informed Consent: I have reviewed the patients History and Physical, chart, labs and discussed the procedure including the risks, benefits and alternatives for the proposed anesthesia with the patient or authorized representative who has indicated his/her understanding and acceptance.   Dental advisory given  Plan Discussed with: CRNA, Anesthesiologist and Surgeon  Anesthesia Plan Comments:         Anesthesia Quick Evaluation

## 2015-10-26 NOTE — Anesthesia Postprocedure Evaluation (Signed)
Anesthesia Post Note  Patient: Fred Mullins  Procedure(s) Performed: Procedure(s) (LRB): OPEN REDUCTION INTERNAL FIXATION (ORIF) LEFT FIBULA FRACTURE (Left)  Patient location during evaluation: PACU Anesthesia Type: General Level of consciousness: awake and alert and oriented Pain management: pain level controlled Vital Signs Assessment: post-procedure vital signs reviewed and stable Respiratory status: spontaneous breathing, nonlabored ventilation and respiratory function stable Cardiovascular status: blood pressure returned to baseline and stable Postop Assessment: no signs of nausea or vomiting Anesthetic complications: no    Last Vitals:  Filed Vitals:   10/26/15 1315 10/26/15 1318  BP: 123/67 117/67  Pulse: 68 66  Temp: 36.7 C   Resp: 15 16    Last Pain:  Filed Vitals:   10/26/15 1320  PainSc: 5     LLE Motor Response: Purposeful movement;Responds to commands (10/26/15 1318) LLE Sensation: No numbness;No tingling (10/26/15 1318)          Bobie Kistler A.

## 2015-10-26 NOTE — Op Note (Signed)
10/26/2015  11:43 AM  PATIENT:  Fred Mullins    PRE-OPERATIVE DIAGNOSIS:  Left Fibula Fracture, Weber B  POST-OPERATIVE DIAGNOSIS:  Same  PROCEDURE:  OPEN REDUCTION INTERNAL FIXATION (ORIF) LEFT FIBULA FRACTURE  SURGEON:  Nadara MustardUDA,Charli Liberatore V, MD  PHYSICIAN ASSISTANT:None ANESTHESIA:   General  PREOPERATIVE INDICATIONS:  Fred Mullins is a  40 y.o. male with a diagnosis of Left Fibula Fracture who failed conservative measures and elected for surgical management.    The risks benefits and alternatives were discussed with the patient preoperatively including but not limited to the risks of infection, bleeding, nerve injury, cardiopulmonary complications, the need for revision surgery, among others, and the patient was willing to proceed.  OPERATIVE IMPLANTS: 7-hole one third tubular plate  OPERATIVE FINDINGS: Congruent mortise postoperatively  OPERATIVE PROCEDURE: Patient was brought to the operating room and underwent a general anesthetic. After adequate levels anesthesia were obtained patient's left lower extremity was prepped using DuraPrep draped into a sterile field a timeout was called. A lateral incision was made this is carried sharply down to the fibula. Subperiosteal dissection was used to cleanse the fracture site. Reduction clamps were then used to lengthen the fibula reduced the fracture this was stabilized with a interfrag screw. A one third tubular plate was contoured and applied as an anti-glide plate this was secured with 3 cortical compression screws proximally and 2 locking screws distally. C-arm fluoroscopy verified congruence to the mortise with alignment of the fixation. The wound was irrigated with normal saline incision was closed using 2-0 nylon. A sterile compressive dressing was applied patient was extubated taken the PACU in stable condition.

## 2015-10-26 NOTE — Transfer of Care (Signed)
Immediate Anesthesia Transfer of Care Note  Patient: Fred Mullins  Procedure(s) Performed: Procedure(s): OPEN REDUCTION INTERNAL FIXATION (ORIF) LEFT FIBULA FRACTURE (Left)  Patient Location: PACU  Anesthesia Type:General  Level of Consciousness: awake, alert  and oriented  Airway & Oxygen Therapy: Patient Spontanous Breathing  Post-op Assessment: Report given to RN and Post -op Vital signs reviewed and stable  Post vital signs: Reviewed and stable  Last Vitals:  Filed Vitals:   10/26/15 0853 10/26/15 1151  BP: 129/74 121/88  Pulse: 77 74  Temp: 37.2 C 36.8 C  Resp: 18 13    Last Pain: There were no vitals filed for this visit.    Patients Stated Pain Goal: 2 (10/26/15 16100851)  Complications: No apparent anesthesia complications

## 2015-10-29 ENCOUNTER — Encounter (HOSPITAL_COMMUNITY): Payer: Self-pay | Admitting: Orthopedic Surgery

## 2016-01-20 ENCOUNTER — Emergency Department (HOSPITAL_COMMUNITY)
Admission: EM | Admit: 2016-01-20 | Discharge: 2016-01-20 | Disposition: A | Discharge status: Home / Self Care | Payer: Self-pay | Attending: Emergency Medicine | Admitting: Emergency Medicine

## 2016-01-20 ENCOUNTER — Encounter (HOSPITAL_COMMUNITY): Payer: Self-pay | Admitting: Emergency Medicine

## 2016-01-20 DIAGNOSIS — Z87891 Personal history of nicotine dependence: Secondary | ICD-10-CM | POA: Insufficient documentation

## 2016-01-20 DIAGNOSIS — J020 Streptococcal pharyngitis: Secondary | ICD-10-CM | POA: Insufficient documentation

## 2016-01-20 LAB — RAPID STREP SCREEN (MED CTR MEBANE ONLY): Streptococcus, Group A Screen (Direct): POSITIVE — AB

## 2016-01-20 MED ORDER — ACETAMINOPHEN 325 MG PO TABS
ORAL_TABLET | ORAL | Status: AC
  Filled 2016-01-20: qty 2

## 2016-01-20 MED ORDER — ACETAMINOPHEN 325 MG PO TABS
650.0000 mg | ORAL_TABLET | Freq: Once | ORAL | Status: AC | PRN
  Administered 2016-01-20: 650 mg via ORAL

## 2016-01-20 MED ORDER — PENICILLIN G BENZATHINE 1200000 UNIT/2ML IM SUSP
1.2000 10*6.[IU] | Freq: Once | INTRAMUSCULAR | Status: AC
  Administered 2016-01-20: 1.2 10*6.[IU] via INTRAMUSCULAR
  Filled 2016-01-20: qty 2

## 2016-01-20 NOTE — ED Provider Notes (Signed)
[facility]-EMERGENCY DEPT Provider Note   CSN: [id number] Arrival date & time: 01/20/16  1141     History   Chief Complaint Chief Complaint  Patient presents with  . Headache  . Sore Throat  . Fever    HPI Fred Mullins is a 40 y.o. male.  Patient presents today with a chief complaint of sore throat onset yesterday.  He states that the pain is gradually worsening.  He also has an associated headache.  He states that he felt like he had a fever, but never checked his temperature.  Pain worsens with swallowing, but he denies difficulty swallowing.  He has not taken anything for his symptoms prior to arrival.  He denies cough, SOB, nausea, vomiting, neck stiffness, or any other symptoms.  Patient speaks Spanish, but a phone interpretor was used.        Past Medical History:  Diagnosis Date  . Allergic rhinitis 12/15/2012  . Hyperlipemia   . Kidney stones   . Umbilical hernia   . Varicose veins     Patient Active Problem List   Diagnosis Date Noted  . Family history of diabetes mellitus 11/04/2013  . Hypertriglyceridemia 11/04/2013  . Unspecified vitamin D deficiency 11/04/2013  . Hemorrhoid 11/04/2013  . Unspecified constipation 11/04/2013  . Dental caries 12/16/2012  . 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 Allergic rhinitis 12/15/2012  . Chronic headaches 12/15/2012    Past Surgical History:  Procedure Laterality Date  . FOOT SURGERY     cyst removed from foot as a child  . ORIF ANKLE FRACTURE Left 10/26/2015   Procedure: OPEN REDUCTION INTERNAL FIXATION (ORIF) LEFT FIBULA FRACTURE;  Surgeon: Nadara MustardMarcus Duda V, MD;  Location: [facility] OR;  Service: Orthopedics;  Laterality: Left;       Home Medications    Prior to Admission medications   Medication Sig Start Date End Date Taking? Authorizing Provider  acetaminophen (TYLENOL) 500 MG tablet Take 1,000 mg by mouth every 6 (six) hours as needed for moderate pain or headache.   Yes Historical Provider, MD    ibuprofen (ADVIL,MOTRIN) 200 MG tablet Take 200 mg by mouth every 6 (six) hours as needed (For pain.).   Yes Historical Provider, MD  OVER THE COUNTER MEDICATION Take 1 spray by mouth as needed (for sore throat). Antiseptic Throat Spray OTC   Yes Historical Provider, MD  Pseudoeph-Doxylamine-DM-APAP (NYQUIL PO) Take 1 each by mouth as needed (for congestion).   Yes Historical Provider, MD  cetirizine (ZYRTEC) 10 MG tablet Take 1 tablet (10 mg total) by mouth daily. Patient taking differently: Take 10 mg by mouth daily as needed for allergies.  12/15/12   Clanford Cyndie MullL Johnson, MD  fluticasone (FLONASE) 50 MCG/ACT nasal spray INSTILL 2 SPRAYS INT0 EACH NOSTRIL ONCE DAILY Patient not taking: Reported on 01/20/2016 09/08/14   Doris Cheadleeepak Advani, MD  oxyCODONE-acetaminophen (PERCOCET/ROXICET) 5-325 MG tablet Take 2 tablets by mouth every 6 (six) hours as needed for severe pain. Patient not taking: Reported on 01/20/2016 10/21/15   Melton KrebsSamantha Nicole Riley, PA-C  oxyCODONE-acetaminophen (ROXICET) 5-325 MG tablet Take 1 tablet by mouth every 4 (four) hours as needed for severe pain. Patient not taking: Reported on 01/20/2016 10/26/15   Nadara MustardMarcus V Duda, MD    Family History Family History  Problem Relation Age of Onset  . Hyperlipidemia Mother   . Diabetes Father     Social History Social History  Substance Use Topics  . Smoking status: Former Smoker    Types: Cigarettes  .  Smokeless tobacco: Never Used  . Alcohol use 12.0 oz/week    20 Cans of beer per week     Allergies   Pollen extract   Review of Systems Review of Systems  All other systems reviewed and are negative.    Physical Exam Updated Vital Signs BP 101/62   Pulse 68   Temp 98.4 F (36.9 C) (Oral)   Resp 18   Ht 5\' 6"  (1.676 m)   Wt 86.2 kg   SpO2 97%   BMI 30.67 kg/m   Physical Exam  Constitutional: He appears well-developed and well-nourished.  HENT:  Head: Normocephalic and atraumatic.  Mild bilateral tonsillar  enlargement with exudates Uvula midline Normal voice phonation. Handling secretions well, no drooling  No trismus  Neck: Normal range of motion. Neck supple.  No nuchal rigidity.  Full ROM of the neck  Cardiovascular: Normal rate and regular rhythm.   Pulmonary/Chest: Effort normal and breath sounds normal.  Neurological: He is alert.  Skin: Skin is warm and dry. No rash noted.  Nursing note and vitals reviewed.    ED Treatments / Results  Labs (all labs ordered are listed, but only abnormal results are displayed) Labs Reviewed  RAPID STREP SCREEN (NOT [facility]) - Abnormal; Notable for the following:       Result Value   Streptococcus, Group A Screen (Direct) POSITIVE (*)    All other components within normal limits    EKG  EKG Interpretation None       Radiology No results found.  Procedures Procedures (including critical care time)  Medications Ordered in ED Medications  acetaminophen (TYLENOL) tablet 650 mg (650 mg Oral Given 01/20/16 1155)  penicillin g benzathine (BICILLIN LA) 1200000 UNIT/2ML injection 1.2 Million Units (1.2 Million Units Intramuscular Given 01/20/16 1503)     Initial Impression / Assessment and Plan / ED Course  I have reviewed the triage vital signs and the nursing notes.  Pertinent labs & imaging results that were available during my care of the patient were reviewed by me and considered in my medical decision making (see chart for details).  Clinical Course   Patient presents today with a sore throat onset yesterday.  Rapid strep positive. Patient given IM Bicillin in the ED.  No signs of PTA or Retropharyngeal abscess at this time.  Patient stable for discharge.  Return precautions given.  Final Clinical Impressions(s) / ED Diagnoses   Final diagnoses:  None    New Prescriptions New Prescriptions   No medications on file     Santiago GladHeather Shyia Fillingim, PA-C 01/23/16 [id number]    Raeford RazorStephen Kohut, MD 01/24/16 1324

## 2016-01-20 NOTE — ED Triage Notes (Signed)
Pt c/o sore throat x 3 weeks, headache and fever onset last night. Pt has difficulty swallowing.

## 2016-06-30 ENCOUNTER — Emergency Department (HOSPITAL_COMMUNITY)
Admission: EM | Admit: 2016-06-30 | Discharge: 2016-06-30 | Disposition: A | Discharge status: Home / Self Care | Payer: Self-pay | Attending: Emergency Medicine | Admitting: Emergency Medicine

## 2016-06-30 ENCOUNTER — Emergency Department (HOSPITAL_COMMUNITY): Payer: Self-pay

## 2016-06-30 ENCOUNTER — Encounter (HOSPITAL_COMMUNITY): Payer: Self-pay | Admitting: Emergency Medicine

## 2016-06-30 DIAGNOSIS — R059 Cough, unspecified: Secondary | ICD-10-CM

## 2016-06-30 DIAGNOSIS — R05 Cough: Secondary | ICD-10-CM | POA: Insufficient documentation

## 2016-06-30 DIAGNOSIS — Z87891 Personal history of nicotine dependence: Secondary | ICD-10-CM | POA: Insufficient documentation

## 2016-06-30 DIAGNOSIS — R0789 Other chest pain: Secondary | ICD-10-CM | POA: Insufficient documentation

## 2016-06-30 LAB — CBC WITH DIFFERENTIAL/PLATELET
Basophils Absolute: 0 10*3/uL (ref 0.0–0.1)
Basophils Relative: 1 %
EOS ABS: 0.2 10*3/uL (ref 0.0–0.7)
Eosinophils Relative: 3 %
HEMATOCRIT: 45.1 % (ref 39.0–52.0)
HEMOGLOBIN: 15.8 g/dL (ref 13.0–17.0)
LYMPHS ABS: 3 10*3/uL (ref 0.7–4.0)
Lymphocytes Relative: 34 %
MCH: 29.9 pg (ref 26.0–34.0)
MCHC: 35 g/dL (ref 30.0–36.0)
MCV: 85.3 fL (ref 78.0–100.0)
MONOS PCT: 8 %
Monocytes Absolute: 0.7 10*3/uL (ref 0.1–1.0)
NEUTROS PCT: 54 %
Neutro Abs: 4.8 10*3/uL (ref 1.7–7.7)
Platelets: 242 10*3/uL (ref 150–400)
RBC: 5.29 MIL/uL (ref 4.22–5.81)
RDW: 12.8 % (ref 11.5–15.5)
WBC: 8.7 10*3/uL (ref 4.0–10.5)

## 2016-06-30 LAB — I-STAT TROPONIN, ED: Troponin i, poc: 0 ng/mL (ref 0.00–0.08)

## 2016-06-30 LAB — BASIC METABOLIC PANEL
Anion gap: 11 (ref 5–15)
BUN: 15 mg/dL (ref 6–20)
CHLORIDE: 104 mmol/L (ref 101–111)
CO2: 22 mmol/L (ref 22–32)
CREATININE: 0.66 mg/dL (ref 0.61–1.24)
Calcium: 9.3 mg/dL (ref 8.9–10.3)
GFR calc Af Amer: >60 mL/min (ref >60)
GFR calc non Af Amer: >60 mL/min (ref >60)
GLUCOSE: 92 mg/dL (ref 65–99)
Potassium: 4.4 mmol/L (ref 3.5–5.1)
SODIUM: 137 mmol/L (ref 135–145)

## 2016-06-30 NOTE — ED Triage Notes (Signed)
Pt reports cough and headache, states he was given abx by pcp last week but does not feel like symptoms have resolved. Pt denies fever, resp e/u.

## 2016-06-30 NOTE — ED Provider Notes (Signed)
[facility]-EMER[facility]Y DEPT Provider Note   CSN: [id number] Arrival date & time: 06/30/16  1545  By signing my name below, I, Linna DarnerRussell Turner, attest that this documentation has been prepared under the direction and in the presence of Mathews RobinsonsJessica Natnael Biederman, PA-C. Electronically Signed: Linna Darnerussell Turner, Scribe. 06/30/2016. 5:38 PM.  History   Chief Complaint Chief Complaint  Patient presents with  . Cough  . Headache    The history is provided by the patient. No language interpreter was used.     HPI Comments: Fred Mullins is a 41 y.o. male who presents to the Emergency Department complaining of a persistent cough for 8 days. He reports associated subjective fever/chills, headache, and SOB secondary to his cough. He also reports some non-radiating substernal chest pressure that is not improving and is worse with coughing. He notes he had myalgias initially that have now resolved. He states his cough is worse with lying down and he has difficulty taking a deep breath in this position. Pt was prescribed amoxicillin, doxycycline, and benzonatate by his PCP for the same but did not finish the courses of these medications. No other medications or treatments tried. No h/o DM or HTN. No FMHx of heart problems. NKDA. He denies sore throat, ear pain, sinus pain/pressure, abdominal pain, nausea, vomiting, or any other associated symptoms.  Past Medical History:  Diagnosis Date  . Allergic rhinitis 12/15/2012  . Hyperlipemia   . Kidney stones   . Umbilical hernia   . Varicose veins     Patient Active Problem List   Diagnosis Date Noted  . Family history of diabetes mellitus 11/04/2013  . Hypertriglyceridemia 11/04/2013  . Unspecified vitamin D deficiency 11/04/2013  . Hemorrhoid 11/04/2013  . Unspecified constipation 11/04/2013  . Dental caries 12/16/2012  . 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 Allergic rhinitis 12/15/2012  . Chronic headaches 12/15/2012    Past Surgical History:   Procedure Laterality Date  . FOOT SURGERY     cyst removed from foot as a child  . ORIF ANKLE FRACTURE Left 10/26/2015   Procedure: OPEN REDUCTION INTERNAL FIXATION (ORIF) LEFT FIBULA FRACTURE;  Surgeon: Nadara MustardMarcus Duda V, MD;  Location: [facility] OR;  Service: Orthopedics;  Laterality: Left;       Home Medications    Prior to Admission medications   Medication Sig Start Date End Date Taking? Authorizing Provider  acetaminophen (TYLENOL) 500 MG tablet Take 1,000 mg by mouth every 6 (six) hours as needed for moderate pain or headache.    Historical Provider, MD  cetirizine (ZYRTEC) 10 MG tablet Take 1 tablet (10 mg total) by mouth daily. Patient taking differently: Take 10 mg by mouth daily as needed for allergies.  12/15/12   Clanford Cyndie MullL Johnson, MD  fluticasone (FLONASE) 50 MCG/ACT nasal spray INSTILL 2 SPRAYS INT0 EACH NOSTRIL ONCE DAILY Patient not taking: Reported on 01/20/2016 09/08/14   Doris Cheadleeepak Advani, MD  ibuprofen (ADVIL,MOTRIN) 200 MG tablet Take 200 mg by mouth every 6 (six) hours as needed (For pain.).    Historical Provider, MD  OVER THE COUNTER MEDICATION Take 1 spray by mouth as needed (for sore throat). Antiseptic Throat Spray OTC    Historical Provider, MD  oxyCODONE-acetaminophen (PERCOCET/ROXICET) 5-325 MG tablet Take 2 tablets by mouth every 6 (six) hours as needed for severe pain. Patient not taking: Reported on 01/20/2016 10/21/15   Melton KrebsSamantha Nicole Riley, PA-C  oxyCODONE-acetaminophen (ROXICET) 5-325 MG tablet Take 1 tablet by mouth every 4 (four) hours as needed for severe pain. Patient  not taking: Reported on 01/20/2016 10/26/15   Nadara Mustard, MD  Pseudoeph-Doxylamine-DM-APAP (NYQUIL PO) Take 1 each by mouth as needed (for congestion).    Historical Provider, MD    Family History Family History  Problem Relation Age of Onset  . Hyperlipidemia Mother   . Diabetes Father     Social History Social History  Substance Use Topics  . Smoking status: Former Smoker    Types:  Cigarettes  . Smokeless tobacco: Never Used  . Alcohol use 12.0 oz/week    20 Cans of beer per week     Allergies   Pollen extract   Review of Systems Review of Systems  Constitutional: Positive for chills and fever.  HENT: Negative for ear pain, sinus pain, sinus pressure and sore throat.   Eyes: Negative for pain.  Respiratory: Positive for cough and shortness of breath.   Cardiovascular: Positive for chest pain (chest pressure).  Gastrointestinal: Negative for abdominal pain, nausea and vomiting.  Musculoskeletal: Positive for myalgias (resolved).  Neurological: Positive for headaches.  All other systems reviewed and are negative.    Physical Exam Updated Vital Signs BP 126/75 (BP Location: Right Arm)   Pulse 71   Temp 97.8 F (36.6 C) (Oral)   Resp 16   SpO2 97%   Physical Exam  Constitutional: He is oriented to person, place, and time. He appears well-developed and well-nourished. No distress.  Patient is afebrile, nontoxic-appearing, sitting comfortably in chair in no acute distress.  HENT:  Head: Normocephalic and atraumatic.  Mouth/Throat: Oropharynx is clear and moist. No oropharyngeal exudate.  Eyes: Conjunctivae and EOM are normal. Right eye exhibits no discharge. Left eye exhibits no discharge.  Neck: Neck supple. No JVD present. No tracheal deviation present.  Cardiovascular: Normal rate, regular rhythm and normal heart sounds.   No murmur heard. No murmurs or abnormal heart sounds.  Pulmonary/Chest: Effort normal and breath sounds normal. No stridor. No respiratory distress. He has no wheezes. He has no rales. He exhibits no tenderness.  Lungs are clear and equal bilaterally. No wheezing.  Musculoskeletal: Normal range of motion. He exhibits no edema.  Neurological: He is alert and oriented to person, place, and time.  Skin: Skin is warm and dry. No rash noted. He is not diaphoretic. No pallor.  Psychiatric: He has a normal mood and affect. His behavior  is normal.  Nursing note and vitals reviewed.    ED Treatments / Results  Labs (all labs ordered are listed, but only abnormal results are displayed) Labs Reviewed  CBC WITH DIFFERENTIAL/PLATELET  BASIC METABOLIC PANEL  I-STAT TROPOININ, ED    EKG  EKG Interpretation None       Radiology Dg Chest 2 View  Result Date: 06/30/2016 CLINICAL DATA:  Acute onset of cough, subjective fever and chills, headache and shortness of breath. Initial encounter. EXAM: CHEST  2 VIEW COMPARISON:  Chest radiograph performed 06/21/2014 FINDINGS: The lungs are well-aerated and clear. There is no evidence of focal opacification, pleural effusion or pneumothorax. The heart is normal in size; the mediastinal contour is within normal limits. No acute osseous abnormalities are seen. IMPRESSION: No acute cardiopulmonary process seen. Electronically Signed   By: Roanna Raider M.D.   On: 06/30/2016 19:22    Procedures Procedures (including critical care time)  DIAGNOSTIC STUDIES: Oxygen Saturation is 98% on RA, normal by my interpretation.    COORDINATION OF CARE: 5:53 PM Discussed treatment plan with pt at bedside and pt agreed to plan.  Medications  Ordered in ED Medications - No data to display   Initial Impression / Assessment and Plan / ED Course  I have reviewed the triage vital signs and the nursing notes.  Pertinent labs & imaging results that were available during my care of the patient were reviewed by me and considered in my medical decision making (see chart for details).    41 year old otherwise healthy presenting with 1 week of persistent cough. Initially complaining of chest discomfort with coughing and difficulty taking deep breaths. He then describes a constant substernal pressure that have been ongoing for 8 days and wouldn't resolve regardless of positioning or coughing.  Ordered EKG, chest x-ray, troponin and basic labs.  All unremarkable. Provided patient with reassurance and  resources for primary care.  Discharge home on current antibiotic course to be completed with Close follow-up with PCP.  Discussed strict return precautions. Patient was advised to return to the emergency department if experiencing any new or worsening symptoms.Patient understood instructions and agreed with discharge plan.  Patient was discussed with Dr. Juleen China who agrees with assessment and plan.  Discussed strict return precautions. Patient was advised to return to the emergency department if experiencing any new or worsening symptoms. He understood instructions and agreed with discharge plan. Patient was discussed with Dr. Juleen China who agrees with assessment and plan.  Final Clinical Impressions(s) / ED Diagnoses   Final diagnoses:  Cough  Chest wall pain    New Prescriptions New Prescriptions   No medications on file   I personally performed the services described in this documentation, which was scribed in my presence. The recorded information has been reviewed and is accurate.   Georgiana Shore, PA-C 06/30/16 2030    Raeford Razor, MD 07/09/16 [phone]

## 2016-10-02 ENCOUNTER — Ambulatory Visit: Payer: Self-pay | Attending: Internal Medicine | Admitting: Physician Assistant

## 2016-10-02 ENCOUNTER — Encounter: Payer: Self-pay | Admitting: Physician Assistant

## 2016-10-02 VITALS — BP 135/86 | HR 75 | Temp 98.2°F | Resp 16 | Wt 203.6 lb

## 2016-10-02 DIAGNOSIS — H578 Other specified disorders of eye and adnexa: Secondary | ICD-10-CM | POA: Insufficient documentation

## 2016-10-02 DIAGNOSIS — R21 Rash and other nonspecific skin eruption: Secondary | ICD-10-CM | POA: Insufficient documentation

## 2016-10-02 DIAGNOSIS — K429 Umbilical hernia without obstruction or gangrene: Secondary | ICD-10-CM | POA: Insufficient documentation

## 2016-10-02 DIAGNOSIS — X58XXXA Exposure to other specified factors, initial encounter: Secondary | ICD-10-CM | POA: Insufficient documentation

## 2016-10-02 DIAGNOSIS — H5789 Other specified disorders of eye and adnexa: Secondary | ICD-10-CM

## 2016-10-02 DIAGNOSIS — L230 Allergic contact dermatitis due to metals: Secondary | ICD-10-CM

## 2016-10-02 DIAGNOSIS — Z79899 Other long term (current) drug therapy: Secondary | ICD-10-CM | POA: Insufficient documentation

## 2016-10-02 DIAGNOSIS — T7849XA Other allergy, initial encounter: Secondary | ICD-10-CM | POA: Insufficient documentation

## 2016-10-02 MED ORDER — TRIAMCINOLONE ACETONIDE 0.1 % EX CREA: 1.0000 "application " | TOPICAL_CREAM | Freq: Two times a day (BID) | CUTANEOUS | 0 refills | Status: DC

## 2016-10-02 MED ORDER — FLUTICASONE PROPIONATE 50 MCG/ACT NA SUSP: NASAL | 1 refills | Status: DC

## 2016-10-02 MED ORDER — CETIRIZINE HCL 10 MG PO TABS: 10.0000 mg | ORAL_TABLET | Freq: Every day | ORAL | 11 refills | Status: DC

## 2016-10-02 NOTE — Progress Notes (Signed)
Fred Mullins, is a 41 y.o. male  ZOX:[id number]  [id number]:[id number]  DOB - 03/21/1976  Subjective:  Chief Complaint and HPI: Fred Mullins is a 41 y.o. male here today for a few problems 1) Umbilical hernia that causes pain from time to time.  2) rash where belt rubs.  +pruritic; no pain  3)lump in L eye for 3 months.  Not painful.  No vision changes. 4)seasonal allergies-needs RF of zyrtec and flonase  ROS:   Constitutional:  No f/c, No night sweats, No unexplained weight loss. EENT:  No vision changes, No blurry vision, No hearing changes. + allergy; some sneezing Respiratory: No cough, No SOB Cardiac: No CP, no palpitations GI:  No abd pain, No N/V/D. GU: No Urinary s/sx Musculoskeletal: No joint pain Neuro: No headache, no dizziness, no motor weakness.  Skin: No rash Endocrine:  No polydipsia. No polyuria.  Psych: Denies SI/HI  No problems updated.  ALLERGIES: Allergies  Allergen Reactions  . Pollen Extract Other (See Comments)    Unknown    PAST MEDICAL HISTORY: Past Medical History:  Diagnosis Date  . Allergic rhinitis 12/15/2012  . Hyperlipemia   . Kidney stones   . Umbilical hernia   . Varicose veins     MEDICATIONS AT HOME: Prior to Admission medications   Medication Sig Start Date End Date Taking? Authorizing Provider  acetaminophen (TYLENOL) 500 MG tablet Take 1,000 mg by mouth every 6 (six) hours as needed for moderate pain or headache.    Historical Provider, MD  cetirizine (ZYRTEC) 10 MG tablet Take 1 tablet (10 mg total) by mouth daily. 10/02/16   Anders SimmondsAngela M Cassell Voorhies, PA-C  fluticasone (FLONASE) 50 MCG/ACT nasal spray INSTILL 2 SPRAYS INT0 EACH NOSTRIL ONCE DAILY 10/02/16   Anders SimmondsAngela M Joscelyne Renville, PA-C  ibuprofen (ADVIL,MOTRIN) 200 MG tablet Take 200 mg by mouth every 6 (six) hours as needed (For pain.).    Historical Provider, MD  OVER THE COUNTER MEDICATION Take 1 spray by mouth as needed (for sore throat). Antiseptic Throat Spray OTC    Historical Provider, MD   oxyCODONE-acetaminophen (PERCOCET/ROXICET) 5-325 MG tablet Take 2 tablets by mouth every 6 (six) hours as needed for severe pain. Patient not taking: Reported on 01/20/2016 10/21/15   Melton KrebsSamantha Nicole Riley, PA-C  oxyCODONE-acetaminophen (ROXICET) 5-325 MG tablet Take 1 tablet by mouth every 4 (four) hours as needed for severe pain. Patient not taking: Reported on 01/20/2016 10/26/15   Nadara MustardMarcus Duda V, MD  Pseudoeph-Doxylamine-DM-APAP (NYQUIL PO) Take 1 each by mouth as needed (for congestion).    Historical Provider, MD  triamcinolone cream (KENALOG) 0.1 % Apply 1 application topically 2 (two) times daily. 10/02/16   Anders SimmondsAngela M Shelli Portilla, PA-C     Objective:  EXAM:   Vitals:   10/02/16 1617  BP: 135/86  Pulse: 75  Resp: 16  Temp: 98.2 F (36.8 C)  TempSrc: Oral  SpO2: 96%  Weight: 203 lb 9.6 oz (92.4 kg)    General appearance : A&OX3. NAD. Non-toxic-appearing HEENT: Atraumatic and Normocephalic.  PERRLA. EOM intact.  B conjunctivae WNL w/o erythema.  Lower lid of L eye, lateral to inner canthus with what appears to be similar to a hordeolum but is non-tender.  There is a white/pustular appearing center-~3-664mm. No drainage.  TM clear B. Mouth-MMM, post pharynx WNL w/o erythema, No PND.  Turbinates enlarged and boggy Neck: supple, no JVD. No cervical lymphadenopathy. No thyromegaly Chest/Lungs:  Breathing-non-labored, Good air entry bilaterally, breath sounds normal without rales, rhonchi, or wheezing  CVS: S1 S2 regular, no murmurs, gallops, rubs  Abdomen: Bowel sounds present, Non tender and not distended with no gaurding, rigidity or rebound.  There is about 1-2cm umbilical hernia without TTp or sign of incarceration.  Just at beltline, there is an erythematous area that is inflamed at the area of skin that his belt is touching.  There is no cellulitis, TTP, or induration.  No drainage.   Extremities: Bilateral Lower Ext shows no edema, both legs are warm to touch with = pulse  throughout Neurology:  CN II-XII grossly intact, Non focal.   Psych:  TP linear. J/I WNL. Normal speech. Appropriate eye contact and affect.  Skin:  No Rash  Data Review Lab Results  Component Value Date   HGBA1C 5.7 11/04/2013   HGBA1C 6.0 (H) 07/07/2013     Assessment & Plan   1. Mass of left eye ?obstructed lacrimal duct vs cyst?  Appears almost like a stye but not painful and it has been there for ~3 months - Ambulatory referral to Ophthalmology  2. Umbilical hernia without obstruction and without gangrene - Ambulatory referral to General Surgery  3. Nickel allergy, current reaction Triamcinolone cream apply bid.  Avoid nickel in belts  Patient have been counseled extensively about nutrition and exercise  Return in about 1 month (around 11/02/2016) for assign PCP; f/up rash; CPE and fasting bloodwork.  The patient was given clear instructions to go to ER or return to medical center if symptoms don't improve, worsen or new problems develop. The patient verbalized understanding. The patient was told to call to get lab results if they haven't heard anything in the next week.     Georgian Co, PA-C [facility] [place], [place] [phone]   10/02/2016, 4:38 PM

## 2016-10-02 NOTE — Patient Instructions (Addendum)
Hernia en los adultos  (Hernia, Adult)  Una hernia es la protrusin de un rgano o un tejido a travs de un punto dbil en los msculos del abdomen (pared abdominal). La mayora de las veces, las hernias aparecen cerca del ombligo o de la ingle.  Hay muchos tipos de hernias. Los ms frecuentes incluyen los siguientes:   Hernia crural. Este tipo de hernia aparece por debajo de la ingle en la regin superior del muslo.   Hernia inguinal. Este tipo de hernia aparece en la ingle o en el escroto.   Hernia umbilical. Este tipo de hernia aparece cerca del ombligo.   Hernia de hiato. Este tipo de hernia produce el desplazamiento de una porcin del estmago hacia el trax.   Hernia incisional. Este tipo de hernia sobresale a travs de una cicatriz de una ciruga abdominal.  CAUSAS  Este trastorno puede ser causado por:   Levantar peso excesivo.   Toser durante mucho tiempo.   Dificultad para defecar.   Una incisin realizada durante una ciruga abdominal.   Un defecto congnito.   Sobrepeso u obesidad.   Fumar.   Dficit nutricional.   Fibrosis qustica.   Exceso de lquido en el abdomen.   Criptorquidia (testculos retenidos).  SNTOMAS  Los sntomas de una hernia incluyen lo siguiente:   Un bulto en el abdomen, que es el primer signo de una hernia. El bulto puede volverse ms evidente al estar de pie, hacer esfuerzos o toser. Puede aumentar de tamao con el tiempo si no se lo trata o si no se trata la afeccin que lo causa.   Dolor. Generalmente, las hernias son indoloras, pero pueden volverse dolorosas con el tiempo si se retrasa el tratamiento. El dolor suele ser sordo y puede ser ms intenso al estar de pie o levantar objetos pesados.  A veces, una hernia queda constreida en el punto dbil (estrangulada) o atascada (encarcelada) y causa ms sntomas. Estos sntomas pueden incluir los siguientes:   Vmitos.   Nuseas.   Estreimiento.   Irritabilidad.  DIAGNSTICO  El diagnstico de una hernia  puede realizarse mediante lo siguiente:   Un examen fsico. Durante el examen, el mdico puede pedirle que tosa o haga un movimiento especfico, porque, por lo general, una hernia es ms visible cuando la persona se mueve.   Diagnstico por imgenes. Estos pueden incluir los siguientes:  ? Radiografas.  ? Ecografa.  ? Tomografa computarizada.  TRATAMIENTO  Es posible que una hernia pequea e indolora no necesite tratamiento. Una hernia grande o dolorosa puede tratarse con ciruga. Para tratar las hernias inguinales, se puede recurrir a una ciruga para evitar el encarcelamiento o la estrangulacin. Las hernias estranguladas siempre se tratan con ciruga, porque la falta de irrigacin sangunea al rgano o al tejido atascados puede causar su muerte.  La ciruga para tratar una hernia incluye volver a colocar el bulto en su lugar y reparar la zona dbil del abdomen.  INSTRUCCIONES PARA EL CUIDADO EN EL HOGAR   No haga esfuerzos.   No levante ningn objeto que pese ms de 10libras (4,5kg).   Para hacerlo, use los msculos de las piernas, no los de la espalda. Esto ayuda a evitar las sobrecargas.   Cuando tosa, hgalo con suavidad.   Evitar el estreimiento. El estreimiento obliga a realizar esfuerzos de defecacin, los cuales pueden agravar una hernia o causar la rotura de la reparacin. Para evitar el estreimiento, puede hacer lo siguiente:  ? Consuma una dieta con alto contenido   de fibras que incluya gran cantidad de frutas y verduras.  ? Beba suficiente lquido para mantener la orina clara o de color amarillo plido. Pngase como objetivo beber 6 u 8vasos de agua por da.  ? Tome un ablandador de heces como se lo haya indicado el mdico.   Baje de peso, si tiene sobrepeso.   No consuma ningn producto que contenga tabaco, lo que incluye cigarrillos, tabaco de mascar o cigarrillos electrnicos. Si necesita ayuda para dejar de fumar, consulte al mdico.   Concurra a todas las visitas de control como  se lo haya indicado el mdico. Esto es importante. Es posible que el mdico deba controlar su cuadro clnico.    SOLICITE ATENCIN MDICA SI:   Tiene enrojecimiento, hinchazn o dolor en la zona afectada.   Sus hbitos intestinales han cambiado.    SOLICITE ATENCIN MDICA DE INMEDIATO SI:   Tiene fiebre.   Tiene dolor abdominal que empeora.   Siente nuseas o vomita.   No puede volver a colocar la hernia en su lugar al ejercer sobre esta una presin suave mientras est acostado.   La hernia:  ? Cambia de forma o de tamao.  ? Queda atascada fuera del abdomen.  ? Cambia de color.  ? Est dura al tacto o le causa dolor a la palpacin.    Esta informacin no tiene como fin reemplazar el consejo del mdico. Asegrese de hacerle al mdico cualquier pregunta que tenga.  Document Released: 05/19/2005 Document Revised: 06/09/2014 Document Reviewed: 03/29/2014  Elsevier Interactive Patient Education  2017 Elsevier Inc.

## 2016-10-10 ENCOUNTER — Ambulatory Visit: Payer: Self-pay | Attending: Internal Medicine

## 2016-11-03 ENCOUNTER — Ambulatory Visit: Payer: Self-pay | Admitting: Family Medicine

## 2018-01-28 ENCOUNTER — Ambulatory Visit: Payer: Self-pay | Attending: Family Medicine | Admitting: Physician Assistant

## 2018-01-28 VITALS — BP 123/80 | HR 72 | Temp 98.2°F | Resp 18 | Ht 67.0 in | Wt 205.0 lb

## 2018-01-28 DIAGNOSIS — Z79899 Other long term (current) drug therapy: Secondary | ICD-10-CM | POA: Insufficient documentation

## 2018-01-28 DIAGNOSIS — Z87442 Personal history of urinary calculi: Secondary | ICD-10-CM | POA: Insufficient documentation

## 2018-01-28 DIAGNOSIS — Z789 Other specified health status: Secondary | ICD-10-CM

## 2018-01-28 DIAGNOSIS — G4700 Insomnia, unspecified: Secondary | ICD-10-CM

## 2018-01-28 DIAGNOSIS — Z791 Long term (current) use of non-steroidal anti-inflammatories (NSAID): Secondary | ICD-10-CM | POA: Insufficient documentation

## 2018-01-28 DIAGNOSIS — E785 Hyperlipidemia, unspecified: Secondary | ICD-10-CM | POA: Insufficient documentation

## 2018-01-28 DIAGNOSIS — Z603 Acculturation difficulty: Secondary | ICD-10-CM

## 2018-01-28 MED ORDER — TRAZODONE HCL 50 MG PO TABS: 25.0000 mg | ORAL_TABLET | Freq: Every evening | ORAL | 3 refills | Status: DC | PRN

## 2018-01-28 NOTE — Patient Instructions (Signed)
Insomnio  (Insomnia)  El insomnio es un trastorno del sueo que causa dificultades para conciliar el sueo o para mantenerlo. Puede producir cansancio (fatiga), falta de energa, dificultad para concentrarse, cambios en el estado de nimo y mal rendimiento escolar o laboral.  Hay tres formas diferentes de clasificar el insomnio:   Dificultad para conciliar el sueo.   Dificultad para mantener el sueo.   Despertar muy precoz por la maana.  Cualquier tipo de insomnio puede ser a largo plazo (crnico) o a corto plazo (agudo). Ambos son frecuentes. Generalmente, el insomnio a corto plazo dura tres meses o menos tiempo. El crnico ocurre al menos tres veces por semana durante ms de tres meses.  CAUSAS  El insomnio puede deberse a otra afeccin, situacin o sustancia, por ejemplo:   Ansiedad.   Algunos medicamentos.   Enfermedad por reflujo gastroesofgico (ERGE) u otras enfermedades gastrointestinales.   Asma y otras enfermedades respiratorias.   Sndrome de las piernas inquietas, apnea del sueo u otros trastornos del sueo.   Dolor crnico.   Menopausia, que puede incluir calores repentinos.   Ictus.   Consumo excesivo de alcohol, tabaco u drogas ilegales.   Depresin.   Cafena.   Trastornos neurolgicos, como enfermedad de Alzheimer.   Hiperactividad tiroidea (hipertiroidismo).  Es posible que la causa del insomnio no se conozca.  FACTORES DE RIESGO  Los factores de riesgo de tener insomnio incluyen lo siguiente:   El sexo. La mujeres se ven ms afectadas que los hombres.   La edad. El insomnio es ms frecuente a medida que una persona envejece.   El estrs. Esto puede incluir su vida profesional o personal.   Los ingresos. El insomnio es ms frecuente en las personas cuyos ingresos son ms bajos.   La falta de actividad fsica.   Los horarios de trabajo irregulares o los turnos nocturnos.   Los viajes a lugares de diferentes zonas horarias.  SIGNOS Y SNTOMAS  Si tiene insomnio, el sntoma  principal es la dificultad para conciliar el sueo o mantenerlo. Esto puede derivar en otros sntomas, por ejemplo:   Sentirse fatigado.   Ponerse nervioso por tener que irse a dormir.   No sentirse descansado por la maana.   Tener dificultad para concentrarse.   Sentirse irritable, ansioso o deprimido.  TRATAMIENTO  El tratamiento para el insomnio depende de la causa. Si se debe a una enfermedad preexistente, el tratamiento se centrar en el abordaje de la enfermedad. El tratamiento tambin puede incluir lo siguiente:   Medicamentos que lo ayuden a dormir.   Asesoramiento psicolgico o terapia.   Cambios en el estilo de vida.  INSTRUCCIONES PARA EL CUIDADO EN EL HOGAR   Tome los medicamentos solamente como se lo haya indicado el mdico.   Establezca horarios habituales para dormir y despertarse. No tome siestas.   Lleve un registro del sueo ya que podra ser de utilidad para que usted y a su mdico puedan determinar qu podra estar causndole insomnio. Incluya lo siguiente:  ? Cundo duerme.  ? Cundo se despierta durante la noche.  ? Qu tan bien duerme.  ? Qu tan relajado se siente al da siguiente.  ? Cualquier efecto secundario de los medicamentos que toma.  ? Lo que usted come y bebe.   Convierta a su habitacin en un lugar cmodo donde sea fcil conciliar el sueo:  ? Coloque persianas o cortinas especiales oscuras que impidan la entrada de la luz del exterior.  ? Para bloquear los ruidos, use   un aparato que reproduce sonidos ambientales o relajantes de fondo.  ? Mantenga baja la temperatura.   Haga ejercicio regularmente como se lo haya indicado el mdico. No haga ejercicio justo antes de la hora de acostarse.   Utilice tcnicas de relajacin para controlar el estrs. Pdale al mdico que le sugiera algunas tcnicas que sean adecuadas para usted. Estos pueden incluir lo siguiente:  ? Ejercicios de respiracin.  ? Rutinas para aliviar la tensin muscular.  ? Visualizacin de escenas  apacibles.   Disminucin del consumo de alcohol, bebidas con cafena y cigarrillos, especialmente cerca de la hora de acostarse, ya que pueden perturbarle el sueo.   No coma en exceso ni consuma comidas picantes justo antes de la hora de acostarse. Esto puede causarle molestias digestivas y dificultades para dormir.   Limite el uso de pantallas antes de la hora de acostarse. Esto incluye lo siguiente:  ? Mirar televisin.  ? Usar el telfono inteligente, la tableta y la computadora.   Siga una rutina. Esto puede ayudarlo a conciliar el sueo ms rpidamente. Intente hacer una actividad tranquila, cepillarse los dientes e irse a la cama a la misma hora todas las noches.   Levntese de la cama si sigue despierto despus de 15minutos de haber intentado dormirse. Mantenga bajas las luces, pero intente leer o hacer una actividad tranquila. Cuando tenga sueo, regrese a la cama.   Conduzca con cuidado. No conduzca si est muy somnoliento.   Concurra a todas las visitas de control, segn le indique su mdico. Esto es importante.  SOLICITE ATENCIN MDICA SI:   Est cansado durante el da o tiene dificultades en su rutina diaria debido a la somnolencia.   Sigue teniendo problemas para dormir o estos empeoran.  SOLICITE ATENCIN MDICA DE INMEDIATO SI:   Tiene pensamientos serios acerca de lastimarse a usted mismo o daar a otra persona.  Esta informacin no tiene como fin reemplazar el consejo del mdico. Asegrese de hacerle al mdico cualquier pregunta que tenga.  Document Released: 05/19/2005 Document Revised: 06/09/2014 Document Reviewed: 02/17/2014  Elsevier Interactive Patient Education  2018 Elsevier Inc.

## 2018-01-28 NOTE — Progress Notes (Signed)
Patient ID: Fred Mullins, male   DOB: 12/26/1975, 42 y.o.   MRN: [id number]   Fred LoanGabino Mullins, is a 42 y.o. male  [id number]  [id number]  DOB - 01/09/1976  Subjective:  Chief Complaint and HPI: Fred LoanGabino Fred Mullins is a 42 y.o. male here today with 2-3 month h/o sleep difficulties.  Denies any new/unusual stress.  More difficulty going to sleep than staying asleep.  Wife says he snores at night sometimes.  Has tried tylenol pm once-it helped some.    [employer] with [employer] interpre[employer]  ROS:   Constitutional:  No f/c, No night sweats, No unexplained weight loss. EENT:  No vision changes, No blurry vision, No hearing changes. No mouth, throat, or ear problems.  Respiratory: No cough, No SOB Cardiac: No CP, no palpitations GI:  No abd pain, No N/V/D. GU: No Urinary s/sx Musculoskeletal: No joint pain Neuro: No headache, no dizziness, no motor weakness.  Skin: No rash Endocrine:  No polydipsia. No polyuria.  Psych: Denies SI/HI  No problems updated.  ALLERGIES: Allergies  Allergen Reactions  . Pollen Extract Other (See Comments)    Unknown    PAST MEDICAL HISTORY: Past Medical History:  Diagnosis Date  . Allergic rhinitis 12/15/2012  . Hyperlipemia   . Kidney stones   . Umbilical hernia   . Varicose veins     MEDICATIONS AT HOME: Prior to Admission medications   Medication Sig Start Date End Date Taking? Authorizing Provider  acetaminophen (TYLENOL) 500 MG tablet Take 1,000 mg by mouth every 6 (six) hours as needed for moderate pain or headache.   Yes [provider]  cetirizine (ZYRTEC) 10 MG tablet Take 1 tablet (10 mg total) by mouth daily. 10/02/16  Yes McClung, Angela M, PA-C  fluticasone [facility](FLONASE) 50 MCG/ACT nasal spray INSTILL 2 SPRAYS INT0 EACH NOSTRIL ONCE DAILY 10/02/16  Yes McClung, Angela M, PA-C  ibuprofen (ADVIL,MOTRIN) 200 MG tablet Take 200 mg by mouth every 6 (six) hours as needed (For pain.).   Yes [provider]  triamcinolone cream  (KENALOG) 0.1 % Apply 1 application topically 2 (two) times daily. 10/02/16  Yes McClung, Marzella SchleinAngela M, PA-C  OVER THE COUNTER MEDICATION Take 1 spray by mouth as needed (for sore throat). Antiseptic Throat Spray OTC    [provider]  traZODone (DESYREL) 50 MG tablet Take 0.5-1 tablets (25-50 mg total) by mouth at bedtime as needed for sleep. 01/28/18   Anders SimmondsMcClung, Angela M, PA-C     Objective:  EXAM:   Vitals:   01/28/18 0948  BP: 123/80  Pulse: 72  Resp: 18  Temp: 98.2 F (36.8 C)  TempSrc: Oral  SpO2: 99%  Weight: 205 lb (93 kg)  Height: 5\' 7"  (1.702 m)    General appearance : A&OX3. NAD. Non-toxic-appearing HEENT: Atraumatic and Normocephalic.  PERRLA. EOM intact.  TM clear B. Mouth-MMM, post pharynx WNL w/o erythema, No PND. Neck: supple, no JVD. No cervical lymphadenopathy. No thyromegaly Chest/Lungs:  Breathing-non-labored, Good air entry bilaterally, breath sounds normal without rales, rhonchi, or wheezing  CVS: S1 S2 regular, no murmurs, gallops, rubs  Extremities: Bilateral Lower Ext shows no edema, both legs are warm to touch with = pulse throughout Neurology:  CN II-XII grossly intact, Non focal.   Psych:  TP linear. J/I WNL. Normal speech. Appropriate eye contact and affect.  Skin:  No Rash  Data Review Lab Results  Component Value Date   HGBA1C 5.7 11/04/2013   HGBA1C 6.0 (H) 07/07/2013     Assessment &  Plan   1. Insomnia, unspecified type Sleep hygiene discussed and info given - traZODone (DESYREL) 50 MG tablet; Take 0.5-1 tablets (25-50 mg total) by mouth at bedtime as needed for sleep.  Dispense: 30 tablet; Refill: 3  2. Language [employer] interpreters used and additional time performing visit was required.   Patient have been counseled extensively about nutrition and exercise  Return in about 6 weeks (around 03/11/2018) for assign PCP-wants to get a physical.  The patient was given clear instructions to go to ER or return to medical  center if symptoms don't improve, worsen or new problems develop. The patient verbalized understanding. The patient was told to call to get lab results if they haven't heard anything in the next week.     Georgian Co, PA-C [facility] [place], [place] [phone]   01/28/2018, 10:03 AM

## 2018-03-11 ENCOUNTER — Ambulatory Visit: Payer: Self-pay | Admitting: Family Medicine

## 2018-09-04 ENCOUNTER — Emergency Department (HOSPITAL_COMMUNITY)
Admission: EM | Admit: 2018-09-04 | Discharge: 2018-09-04 | Disposition: A | Discharge status: Home / Self Care | Payer: Self-pay | Attending: Emergency Medicine | Admitting: Emergency Medicine

## 2018-09-04 ENCOUNTER — Other Ambulatory Visit: Payer: Self-pay

## 2018-09-04 ENCOUNTER — Encounter (HOSPITAL_COMMUNITY): Payer: Self-pay

## 2018-09-04 ENCOUNTER — Emergency Department (HOSPITAL_COMMUNITY): Payer: Self-pay

## 2018-09-04 DIAGNOSIS — Z87891 Personal history of nicotine dependence: Secondary | ICD-10-CM | POA: Insufficient documentation

## 2018-09-04 DIAGNOSIS — J3089 Other allergic rhinitis: Secondary | ICD-10-CM | POA: Insufficient documentation

## 2018-09-04 DIAGNOSIS — R509 Fever, unspecified: Secondary | ICD-10-CM | POA: Insufficient documentation

## 2018-09-04 DIAGNOSIS — Z79899 Other long term (current) drug therapy: Secondary | ICD-10-CM | POA: Insufficient documentation

## 2018-09-04 NOTE — ED Provider Notes (Signed)
[facility] EMERGENCY DEPARTMENT Provider Note   CSN: [id number] Arrival date & time: 09/04/18  2036    History   Chief Complaint Chief Complaint  Patient presents with  . Cough  . Chest Pain  . Shortness of Breath    HPI Fred Mullins is a 43 y.o. male with history of bad seasonal allergies who presents to the emergency department complaining of intermittent dry hacking cough and shortness of breath now for the past week.  Patient states he has had a sick contact to was influenza positive.  He states that he is had fevers at home however highest temperature he has measured is 99 F.  Although mentioned in triage and chief complaint he adamantly denies any chest pain.  He also denies any recent long travel, periods of immobilization, unilateral lower extremity swelling, hemoptysis.      Illness  Severity:  Mild Onset quality:  Gradual Duration:  1 week Timing:  Constant Progression:  Unchanged Chronicity:  New Associated symptoms: cough, fever (subjective) and shortness of breath   Associated symptoms: no abdominal pain, no chest pain, no ear pain, no rash, no sore throat and no vomiting     Past Medical History:  Diagnosis Date  . Allergic rhinitis 12/15/2012  . Hyperlipemia   . Kidney stones   . Umbilical hernia   . Varicose veins     Patient Active Problem List   Diagnosis Date Noted  . Family history of diabetes mellitus 11/04/2013  . Hypertriglyceridemia 11/04/2013  . Unspecified vitamin D deficiency 11/04/2013  . Hemorrhoid 11/04/2013  . Unspecified constipation 11/04/2013  . Dental caries 12/16/2012  . 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 Allergic rhinitis 12/15/2012  . Chronic headaches 12/15/2012    Past Surgical History:  Procedure Laterality Date  . FOOT SURGERY     cyst removed from foot as a child  . ORIF ANKLE FRACTURE Left 10/26/2015   Procedure: OPEN REDUCTION INTERNAL FIXATION (ORIF) LEFT FIBULA  FRACTURE;  Surgeon: Nadara Mustard, MD;  Location: [facility] OR;  Service: Orthopedics;  Laterality: Left;        Home Medications    Prior to Admission medications   Medication Sig Start Date End Date Taking? Authorizing Provider  acetaminophen (TYLENOL) 500 MG tablet Take 1,000 mg by mouth every 6 (six) hours as needed for moderate pain or headache.    [provider]  cetirizine (ZYRTEC) 10 MG tablet Take 1 tablet (10 mg total) by mouth daily. 10/02/16   Anders Simmonds, PA-C  fluticasone (FLONASE) 50 MCG/ACT nasal spray INSTILL 2 SPRAYS INT0 EACH NOSTRIL ONCE DAILY 10/02/16   Georgian Co M, PA-C  ibuprofen (ADVIL,MOTRIN) 200 MG tablet Take 200 mg by mouth every 6 (six) hours as needed (For pain.).    [provider]  OVER THE COUNTER MEDICATION Take 1 spray by mouth as needed (for sore throat). Antiseptic Throat Spray OTC    [provider]  traZODone (DESYREL) 50 MG tablet Take 0.5-1 tablets (25-50 mg total) by mouth at bedtime as needed for sleep. 01/28/18   Anders Simmonds, PA-C  triamcinolone cream (KENALOG) 0.1 % Apply 1 application topically 2 (two) times daily. 10/02/16   Anders Simmonds, PA-C    Family History Family History  Problem Relation Age of Onset  . Hyperlipidemia Mother   . Diabetes Father     Social History Social History   Tobacco Use  . Smoking status: Former Smoker    Types:  Cigarettes  . Smokeless tobacco: Never Used  Substance Use Topics  . Alcohol use: Not Currently    Alcohol/week: 20.0 standard drinks    Types: 20 Cans of beer per week  . Drug use: No     Allergies   Pollen extract   Review of Systems Review of Systems  Constitutional: Positive for fever (subjective). Negative for chills.  HENT: Negative for ear pain and sore throat.   Eyes: Negative for pain and visual disturbance.  Respiratory: Positive for cough and shortness of breath.   Cardiovascular: Negative for chest pain and palpitations.   Gastrointestinal: Negative for abdominal pain and vomiting.  Genitourinary: Negative for dysuria and hematuria.  Musculoskeletal: Negative for arthralgias and back pain.  Skin: Negative for color change and rash.  Neurological: Negative for seizures and syncope.  All other systems reviewed and are negative.    Physical Exam Updated Vital Signs BP 138/86 (BP Location: Right Arm)   Pulse 83   Temp 98.7 F (37.1 C) (Oral)   Resp 18   SpO2 98%   Physical Exam Vitals signs and nursing note reviewed.  Constitutional:      Appearance: He is well-developed.  HENT:     Head: Normocephalic and atraumatic.  Eyes:     Conjunctiva/sclera: Conjunctivae normal.  Neck:     Musculoskeletal: Neck supple.  Cardiovascular:     Rate and Rhythm: Normal rate and regular rhythm.     Heart sounds: No murmur.  Pulmonary:     Effort: Pulmonary effort is normal. No respiratory distress.     Breath sounds: Normal breath sounds. No stridor. No wheezing, rhonchi or rales.  Abdominal:     Palpations: Abdomen is soft.     Tenderness: There is no abdominal tenderness.  Musculoskeletal: Normal range of motion.     Right lower leg: No edema.     Left lower leg: No edema.  Skin:    General: Skin is warm and dry.  Neurological:     General: No focal deficit present.     Mental Status: He is alert and oriented to person, place, and time.      ED Treatments / Results  Labs (all labs ordered are listed, but only abnormal results are displayed) Labs Reviewed - No data to display  EKG EKG Interpretation  Date/Time:  Saturday September 04 2018 20:49:25 EDT Ventricular Rate:  83 PR Interval:    QRS Duration: 91 QT Interval:  350 QTC Calculation: 412 R Axis:   0 Text Interpretation:  Sinus rhythm No significant change since last tracing Confirmed by Melene Plan [phone]) on 09/04/2018 9:08:53 PM   Radiology Dg Chest Portable 1 View  Result Date: 09/04/2018 CLINICAL DATA:  Cough and fever beginning 1  week ago.  Headache. EXAM: PORTABLE CHEST 1 VIEW COMPARISON:  06/30/2016 FINDINGS: The heart size and mediastinal contours are within normal limits. Both lungs are clear. The visualized skeletal structures are unremarkable. IMPRESSION: No active disease. Electronically Signed   By: Paulina Fusi M.D.   On: 09/04/2018 21:47    Procedures Procedures (including critical care time)  Medications Ordered in ED Medications - No data to display   Initial Impression / Assessment and Plan / ED Course  I have reviewed the triage vital signs and the nursing notes.  Pertinent labs & imaging results that were available during my care of the patient were reviewed by me and considered in my medical decision making (see chart for details).  All patient and her actions performed using Spanish interpretation services by [employer] software.  Patient is a 43 year old male with history of seasonal allergies who presents to the emergency department complaining of subjective fevers, dry hacking cough, and intermittent shortness of breath for the past week.  Patient has known sick contact who reportedly tested positive for influenza.  On initial evaluation of the patient he was hemodynamically stable and nontoxic-appearing.  The patient was afebrile, not tachycardic, normotensive, satting appropriately on room air.  Physical exam as detailed above which showed middle-aged male in no respiratory distress.  Patient has no tachypnea, increased work of breathing, or accessory muscle usage.  Lungs are clear to auscultation throughout all lung fields.  Abdomen is benign and equal pulses in all 4 extremities.  No lower extremity edema is present.  Given nature of the patient's complaints favor his symptoms to be related to viral versus seasonal allergy.  Do not feel his symptoms are reflective of cardiovascular etiology.  Further work-up into this is unnecessary at this time.  He also has no risk factors for pulmonary  embolism and is PERC negative leaving further evaluation for DVT/PE is unnecessary.  Chest x-ray shows no acute cardiac or pulmonary pathology. Symptoms likely related to seasonal allergies given worsening recent pollen blooms. I discussed with the patient that we do not typically test well-appearing patients that are going to be discharged home for COVID-19.  I told him that he should self isolate for the next 2 weeks for presumed infection.  I discussed concerning signs and symptoms that would necessitate return to the emergency department.  Patient voiced understanding of these instructions and had no further questions at this time.  Fred Mullins was evaluated in Emergency Department on 09/04/2018 for the symptoms described in the history of present illness. He was evaluated in the context of the global COVID-19 pandemic, which necessitated consideration that the patient might be at risk for infection with the SARS-CoV-2 virus that causes COVID-19. Institutional protocols and algorithms that pertain to the evaluation of patients at risk for COVID-19 are in a state of rapid change based on information released by regulatory bodies including the CDC and federal and state organizations. These policies and algorithms were followed during the patient's care in the ED.  Final Clinical Impressions(s) / ED Diagnoses   Final diagnoses:  Environmental and seasonal allergies    ED Discharge Orders    None       Leonette Monarch, MD 09/04/18 2209    Melene Plan, DO 09/04/18 2214

## 2018-09-04 NOTE — ED Notes (Signed)
Portable Xray at bedside.

## 2018-09-04 NOTE — ED Triage Notes (Signed)
Pt reports subjective fever last Sunday, headache, cough and recent shortness of breath. Currently afebrile, and not SHOB.

## 2018-09-04 NOTE — ED Notes (Signed)
All appropriate discharge materials reviewed with patient at length. Time for questions provided. Pt denies any further questions at this time. Verbalizes understanding of all provided materials.  

## 2018-09-09 ENCOUNTER — Other Ambulatory Visit: Payer: Self-pay

## 2018-09-09 ENCOUNTER — Ambulatory Visit: Payer: Self-pay | Admitting: Primary Care

## 2019-12-06 ENCOUNTER — Encounter (HOSPITAL_COMMUNITY): Payer: Self-pay

## 2019-12-06 ENCOUNTER — Emergency Department (HOSPITAL_COMMUNITY)
Admission: EM | Admit: 2019-12-06 | Discharge: 2019-12-07 | Disposition: A | Discharge status: Home / Self Care | Payer: Self-pay | Attending: Emergency Medicine | Admitting: Emergency Medicine

## 2019-12-06 DIAGNOSIS — Z5321 Procedure and treatment not carried out due to patient leaving prior to being seen by health care provider: Secondary | ICD-10-CM | POA: Insufficient documentation

## 2019-12-06 DIAGNOSIS — R1031 Right lower quadrant pain: Secondary | ICD-10-CM | POA: Insufficient documentation

## 2019-12-06 DIAGNOSIS — R1032 Left lower quadrant pain: Secondary | ICD-10-CM | POA: Insufficient documentation

## 2019-12-06 LAB — CBC
HCT: 43 % (ref 39.0–52.0)
Hemoglobin: 14.5 g/dL (ref 13.0–17.0)
MCH: 29.4 pg (ref 26.0–34.0)
MCHC: 33.7 g/dL (ref 30.0–36.0)
MCV: 87.2 fL (ref 80.0–100.0)
Platelets: 199 10*3/uL (ref 150–400)
RBC: 4.93 MIL/uL (ref 4.22–5.81)
RDW: 12.6 % (ref 11.5–15.5)
WBC: 6.8 10*3/uL (ref 4.0–10.5)
nRBC: 0 % (ref 0.0–0.2)

## 2019-12-06 LAB — COMPREHENSIVE METABOLIC PANEL
ALT: 71 U/L — ABNORMAL HIGH (ref 0–44)
AST: 45 U/L — ABNORMAL HIGH (ref 15–41)
Albumin: 4.5 g/dL (ref 3.5–5.0)
Alkaline Phosphatase: 78 U/L (ref 38–126)
Anion gap: 11 (ref 5–15)
BUN: 16 mg/dL (ref 6–20)
CO2: 27 mmol/L (ref 22–32)
Calcium: 9.2 mg/dL (ref 8.9–10.3)
Chloride: 101 mmol/L (ref 98–111)
Creatinine, Ser: 0.85 mg/dL (ref 0.61–1.24)
GFR calc Af Amer: >60 mL/min (ref >60)
GFR calc non Af Amer: >60 mL/min (ref >60)
Glucose, Bld: 120 mg/dL — ABNORMAL HIGH (ref 70–99)
Potassium: 3.9 mmol/L (ref 3.5–5.1)
Sodium: 139 mmol/L (ref 135–145)
Total Bilirubin: 0.8 mg/dL (ref 0.3–1.2)
Total Protein: 7.5 g/dL (ref 6.5–8.1)

## 2019-12-06 LAB — URINALYSIS, ROUTINE W REFLEX MICROSCOPIC
Bilirubin Urine: NEGATIVE
Glucose, UA: NEGATIVE mg/dL
Hgb urine dipstick: NEGATIVE
Ketones, ur: NEGATIVE mg/dL
Leukocytes,Ua: NEGATIVE
Nitrite: NEGATIVE
Protein, ur: NEGATIVE mg/dL
Specific Gravity, Urine: 1.025 (ref 1.005–1.030)
pH: 6 (ref 5.0–8.0)

## 2019-12-06 LAB — LIPASE, BLOOD: Lipase: 34 U/L (ref 11–51)

## 2019-12-06 MED ORDER — SODIUM CHLORIDE 0.9% FLUSH: 3.0000 mL | Freq: Once | INTRAVENOUS | Status: DC

## 2019-12-06 NOTE — ED Triage Notes (Addendum)
Pt arrives to ED w/ c/o R/LUQ 8/10 intermitent abdominal pain x 4 days. Pt states pain started at R flank and moved to upper abdomen over the past four days. Pt denies n/v/d. Pt states he has been having normal BMs. Pt denies urinary symptoms.

## 2019-12-07 NOTE — ED Notes (Signed)
Patient no longer wants to be seen tonight.

## 2019-12-10 ENCOUNTER — Emergency Department (HOSPITAL_COMMUNITY): Payer: No Typology Code available for payment source

## 2019-12-10 ENCOUNTER — Emergency Department (HOSPITAL_COMMUNITY)
Admission: EM | Admit: 2019-12-10 | Discharge: 2019-12-10 | Disposition: A | Discharge status: Home / Self Care | Payer: No Typology Code available for payment source | Attending: Emergency Medicine | Admitting: Emergency Medicine

## 2019-12-10 ENCOUNTER — Other Ambulatory Visit: Payer: Self-pay

## 2019-12-10 ENCOUNTER — Encounter (HOSPITAL_COMMUNITY): Payer: Self-pay

## 2019-12-10 DIAGNOSIS — Z20822 Contact with and (suspected) exposure to covid-19: Secondary | ICD-10-CM | POA: Diagnosis not present

## 2019-12-10 DIAGNOSIS — S52352A Displaced comminuted fracture of shaft of radius, left arm, initial encounter for closed fracture: Secondary | ICD-10-CM | POA: Diagnosis not present

## 2019-12-10 DIAGNOSIS — Z87891 Personal history of nicotine dependence: Secondary | ICD-10-CM | POA: Insufficient documentation

## 2019-12-10 DIAGNOSIS — Z79899 Other long term (current) drug therapy: Secondary | ICD-10-CM | POA: Insufficient documentation

## 2019-12-10 DIAGNOSIS — Y999 Unspecified external cause status: Secondary | ICD-10-CM | POA: Insufficient documentation

## 2019-12-10 DIAGNOSIS — Y9389 Activity, other specified: Secondary | ICD-10-CM | POA: Diagnosis not present

## 2019-12-10 DIAGNOSIS — Y929 Unspecified place or not applicable: Secondary | ICD-10-CM | POA: Diagnosis not present

## 2019-12-10 DIAGNOSIS — S0101XA Laceration without foreign body of scalp, initial encounter: Secondary | ICD-10-CM

## 2019-12-10 DIAGNOSIS — S0990XA Unspecified injury of head, initial encounter: Secondary | ICD-10-CM | POA: Diagnosis present

## 2019-12-10 LAB — SARS CORONAVIRUS 2 BY RT PCR (HOSPITAL ORDER, PERFORMED IN ~~LOC~~ HOSPITAL LAB): SARS Coronavirus 2: NEGATIVE

## 2019-12-10 MED ORDER — LIDOCAINE HCL (PF) 1 % IJ SOLN
30.0000 mL | Freq: Once | INTRAMUSCULAR | Status: DC
  Filled 2019-12-10: qty 30

## 2019-12-10 MED ORDER — LIDOCAINE-EPINEPHRINE (PF) 2 %-1:200000 IJ SOLN
10.0000 mL | Freq: Once | INTRAMUSCULAR | Status: DC
  Filled 2019-12-10: qty 20

## 2019-12-10 MED ORDER — TETANUS-DIPHTH-ACELL PERTUSSIS 5-2.5-18.5 LF-MCG/0.5 IM SUSP
0.5000 mL | Freq: Once | INTRAMUSCULAR | Status: AC
  Administered 2019-12-10: 0.5 mL via INTRAMUSCULAR
  Filled 2019-12-10: qty 0.5

## 2019-12-10 NOTE — ED Provider Notes (Signed)
  LACERATION REPAIR Performed by: Langston Masker Authorized by: Langston Masker Consent: Verbal consent obtained. Risks and benefits: risks, benefits and alternatives were discussed Consent given by: patient Patient identity confirmed: provided demographic data Prepped and Draped in normal sterile fashion Wound explored Hair trimmed around edges,  Wound irrigated copiously  Explored no foreign bodies  Laceration Location: left scalp  Laceration Length: 5cm  No Foreign Bodies seen or palpated  Anesthesia: local infiltration  Local anesthetic: lidocaine 1%  Anesthetic total: 6 ml  Irrigation method: syringe Amount of cleaning: standard  Skin closure: staples  Number of staples 8  Technique: staples,    Patient tolerance: Patient tolerated the procedure well with no immediate complications.   Osie Cheeks 12/10/19 2046    Margarita Grizzle, MD 12/10/19 2132

## 2019-12-10 NOTE — ED Notes (Signed)
Utilizing interpreter pt verbalized understanding of d/c instructions, follow up care and s/s requiring return to ed. Pt informed of need to remove sutures in 7 days and surgical requirements for Monday. Pt had no further questions at this time and was transported to exit via wheelchair.

## 2019-12-10 NOTE — Discharge Instructions (Addendum)
KEEP YOUR BANDAGE ON  DO NOT EAT OR DRINK ANYTHING AFTER MIDNIGHT ON Sunday FOR SURGERY ON Monday  STAY ISOLATED BETWEEN NOW AND Monday SO YOU CAN HAVE YOUR SURGERY SAFELY  Head staples out in 7 days
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
## 2019-12-10 NOTE — Progress Notes (Signed)
Orthopedic Tech Progress Note Patient Details:  Fred Mullins 29-Sep-1975 [id number]  Ortho Devices Type of Ortho Device: Arm sling, Sugartong splint Ortho Device/Splint Location: lue Ortho Device/Splint Interventions: Ordered, Application, Adjustment   Post Interventions Patient Tolerated: Well Instructions Provided: Care of device, Adjustment of device   Trinna Post 12/10/2019, 9:41 PM
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
## 2019-12-12 ENCOUNTER — Ambulatory Visit (HOSPITAL_BASED_OUTPATIENT_CLINIC_OR_DEPARTMENT_OTHER)
Admission: AD | Admit: 2019-12-12 | Discharge: 2019-12-12 | Disposition: A | Discharge status: Home / Self Care | Payer: No Typology Code available for payment source | Attending: Orthopedic Surgery | Admitting: Orthopedic Surgery

## 2019-12-12 ENCOUNTER — Encounter (HOSPITAL_BASED_OUTPATIENT_CLINIC_OR_DEPARTMENT_OTHER): Payer: Self-pay | Admitting: Orthopedic Surgery

## 2019-12-12 ENCOUNTER — Ambulatory Visit (HOSPITAL_BASED_OUTPATIENT_CLINIC_OR_DEPARTMENT_OTHER): Payer: No Typology Code available for payment source | Admitting: Certified Registered"

## 2019-12-12 ENCOUNTER — Ambulatory Visit (HOSPITAL_COMMUNITY): Payer: No Typology Code available for payment source

## 2019-12-12 ENCOUNTER — Encounter (HOSPITAL_BASED_OUTPATIENT_CLINIC_OR_DEPARTMENT_OTHER)
Admission: AD | Disposition: A | Discharge status: Home / Self Care | Payer: Self-pay | Source: Home / Self Care | Attending: Orthopedic Surgery

## 2019-12-12 DIAGNOSIS — W228XXA Striking against or struck by other objects, initial encounter: Secondary | ICD-10-CM | POA: Insufficient documentation

## 2019-12-12 DIAGNOSIS — S52572A Other intraarticular fracture of lower end of left radius, initial encounter for closed fracture: Secondary | ICD-10-CM | POA: Insufficient documentation

## 2019-12-12 DIAGNOSIS — Z8249 Family history of ischemic heart disease and other diseases of the circulatory system: Secondary | ICD-10-CM | POA: Insufficient documentation

## 2019-12-12 DIAGNOSIS — Z87891 Personal history of nicotine dependence: Secondary | ICD-10-CM | POA: Diagnosis not present

## 2019-12-12 DIAGNOSIS — E785 Hyperlipidemia, unspecified: Secondary | ICD-10-CM | POA: Insufficient documentation

## 2019-12-12 DIAGNOSIS — Z419 Encounter for procedure for purposes other than remedying health state, unspecified: Secondary | ICD-10-CM

## 2019-12-12 HISTORY — PX: ORIF WRIST FRACTURE: SHX2133

## 2019-12-12 SURGERY — OPEN REDUCTION INTERNAL FIXATION (ORIF) WRIST FRACTURE
Anesthesia: General | Site: Wrist | Laterality: Left

## 2019-12-12 MED ORDER — FENTANYL CITRATE (PF) 100 MCG/2ML IJ SOLN
INTRAMUSCULAR | Status: AC
  Filled 2019-12-12: qty 2

## 2019-12-12 MED ORDER — MIDAZOLAM HCL 2 MG/2ML IJ SOLN
INTRAMUSCULAR | Status: AC
  Filled 2019-12-12: qty 2

## 2019-12-12 MED ORDER — OXYCODONE HCL 5 MG PO TABS
ORAL_TABLET | ORAL | Status: AC
  Filled 2019-12-12: qty 1

## 2019-12-12 MED ORDER — FENTANYL CITRATE (PF) 100 MCG/2ML IJ SOLN
100.0000 ug | Freq: Once | INTRAMUSCULAR | Status: AC
  Administered 2019-12-12: 100 ug via INTRAVENOUS

## 2019-12-12 MED ORDER — ONDANSETRON HCL 4 MG/2ML IJ SOLN
INTRAMUSCULAR | Status: AC
  Filled 2019-12-12: qty 2

## 2019-12-12 MED ORDER — MIDAZOLAM HCL 2 MG/2ML IJ SOLN
2.0000 mg | Freq: Once | INTRAMUSCULAR | Status: AC
  Administered 2019-12-12: 2 mg via INTRAVENOUS

## 2019-12-12 MED ORDER — OXYCODONE HCL 5 MG PO TABS: 5.0000 mg | ORAL_TABLET | Freq: Four times a day (QID) | ORAL | 0 refills | Status: DC | PRN

## 2019-12-12 MED ORDER — OXYCODONE HCL 5 MG/5ML PO SOLN: 5.0000 mg | Freq: Once | ORAL | Status: AC | PRN

## 2019-12-12 MED ORDER — IBUPROFEN 200 MG PO TABS
600.0000 mg | ORAL_TABLET | Freq: Four times a day (QID) | ORAL | 0 refills | Status: AC
Start: 2019-12-12 — End: 2019-12-27

## 2019-12-12 MED ORDER — MEPERIDINE HCL 25 MG/ML IJ SOLN: 6.2500 mg | INTRAMUSCULAR | Status: DC | PRN

## 2019-12-12 MED ORDER — ONDANSETRON HCL 4 MG/2ML IJ SOLN: 4.0000 mg | Freq: Once | INTRAMUSCULAR | Status: DC | PRN

## 2019-12-12 MED ORDER — CEFAZOLIN SODIUM-DEXTROSE 2-4 GM/100ML-% IV SOLN
2.0000 g | INTRAVENOUS | Status: AC
  Administered 2019-12-12: 2 g via INTRAVENOUS

## 2019-12-12 MED ORDER — ONDANSETRON HCL 4 MG/2ML IJ SOLN
INTRAMUSCULAR | Status: DC | PRN
  Administered 2019-12-12 (×2): 4 mg via INTRAVENOUS

## 2019-12-12 MED ORDER — OXYCODONE HCL 5 MG PO TABS
5.0000 mg | ORAL_TABLET | Freq: Once | ORAL | Status: AC | PRN
  Administered 2019-12-12: 5 mg via ORAL

## 2019-12-12 MED ORDER — LIDOCAINE HCL (CARDIAC) PF 100 MG/5ML IV SOSY
PREFILLED_SYRINGE | INTRAVENOUS | Status: DC | PRN
  Administered 2019-12-12: 30 mg via INTRAVENOUS

## 2019-12-12 MED ORDER — PROPOFOL 500 MG/50ML IV EMUL
INTRAVENOUS | Status: DC | PRN
  Administered 2019-12-12: 125 ug/kg/min via INTRAVENOUS

## 2019-12-12 MED ORDER — LACTATED RINGERS IV SOLN: INTRAVENOUS | Status: DC

## 2019-12-12 MED ORDER — FENTANYL CITRATE (PF) 100 MCG/2ML IJ SOLN
25.0000 ug | INTRAMUSCULAR | Status: DC | PRN
  Administered 2019-12-12: 50 ug via INTRAVENOUS

## 2019-12-12 MED ORDER — ACETAMINOPHEN 325 MG PO TABS
650.0000 mg | ORAL_TABLET | Freq: Four times a day (QID) | ORAL | 0 refills | Status: AC
Start: 2019-12-12 — End: 2019-12-27

## 2019-12-12 MED ORDER — ACETAMINOPHEN 325 MG PO TABS: 325.0000 mg | ORAL_TABLET | ORAL | Status: DC | PRN

## 2019-12-12 MED ORDER — BUPIVACAINE-EPINEPHRINE (PF) 0.5% -1:200000 IJ SOLN
INTRAMUSCULAR | Status: DC | PRN
Start: 2019-12-12 — End: 2019-12-12
  Administered 2019-12-12: 10 mL

## 2019-12-12 MED ORDER — CEFAZOLIN SODIUM-DEXTROSE 2-4 GM/100ML-% IV SOLN
INTRAVENOUS | Status: AC
  Filled 2019-12-12: qty 100

## 2019-12-12 MED ORDER — BUPIVACAINE LIPOSOME 1.3 % IJ SUSP
INTRAMUSCULAR | Status: DC | PRN
Start: 2019-12-12 — End: 2019-12-12
  Administered 2019-12-12: 10 mL

## 2019-12-12 MED ORDER — ACETAMINOPHEN 160 MG/5ML PO SOLN: 325.0000 mg | ORAL | Status: DC | PRN

## 2019-12-12 MED ORDER — FENTANYL CITRATE (PF) 100 MCG/2ML IJ SOLN
INTRAMUSCULAR | Status: DC | PRN
  Administered 2019-12-12: 25 ug via INTRAVENOUS

## 2019-12-12 MED ORDER — FENTANYL CITRATE (PF) 100 MCG/2ML IJ SOLN
50.0000 ug | Freq: Once | INTRAMUSCULAR | Status: AC
  Administered 2019-12-12: 50 ug via INTRAVENOUS

## 2019-12-12 SURGICAL SUPPLY — 73 items
APL PRP STRL LF DISP 70% ISPRP (MISCELLANEOUS) ×1
BAND INSRT 18 STRL LF DISP RB (MISCELLANEOUS)
BAND RUBBER #18 3X1/16 STRL (MISCELLANEOUS) IMPLANT
BIT DRILL 2 FAST STEP (BIT) ×3 IMPLANT
BIT DRILL 2.5X4 QC (BIT) ×3 IMPLANT
BLADE MINI RND TIP GREEN BEAV (BLADE) IMPLANT
BLADE SURG 15 STRL LF DISP TIS (BLADE) ×1 IMPLANT
BLADE SURG 15 STRL SS (BLADE) ×3
BNDG CMPR 9X4 STRL LF SNTH (GAUZE/BANDAGES/DRESSINGS) ×1
BNDG COHESIVE 2X5 TAN STRL LF (GAUZE/BANDAGES/DRESSINGS) IMPLANT
BNDG COHESIVE 4X5 TAN STRL (GAUZE/BANDAGES/DRESSINGS) ×3 IMPLANT
BNDG ESMARK 4X9 LF (GAUZE/BANDAGES/DRESSINGS) ×3 IMPLANT
BNDG GAUZE ELAST 4 BULKY (GAUZE/BANDAGES/DRESSINGS) ×3 IMPLANT
BRUSH SCRUB EZ PLAIN DRY (MISCELLANEOUS) ×3 IMPLANT
CANISTER SUCT 1200ML W/VALVE (MISCELLANEOUS) ×3 IMPLANT
CHLORAPREP W/TINT 26 (MISCELLANEOUS) ×3 IMPLANT
CORD BIPOLAR FORCEPS 12FT (ELECTRODE) ×3 IMPLANT
COVER BACK TABLE 60X90IN (DRAPES) ×3 IMPLANT
COVER MAYO STAND STRL (DRAPES) ×3 IMPLANT
COVER WAND RF STERILE (DRAPES) IMPLANT
CUFF TOURN SGL QUICK 18X4 (TOURNIQUET CUFF) ×3 IMPLANT
CUFF TOURN SGL QUICK 24 (TOURNIQUET CUFF)
CUFF TRNQT CYL 24X4X16.5-23 (TOURNIQUET CUFF) IMPLANT
DRAPE C-ARM 42X72 X-RAY (DRAPES) ×6 IMPLANT
DRAPE EXTREMITY T 121X128X90 (DISPOSABLE) ×3 IMPLANT
DRAPE SURG 17X23 STRL (DRAPES) ×3 IMPLANT
DRSG ADAPTIC 3X8 NADH LF (GAUZE/BANDAGES/DRESSINGS) ×3 IMPLANT
DRSG EMULSION OIL 3X3 NADH (GAUZE/BANDAGES/DRESSINGS) IMPLANT
ELECT REM PT RETURN 9FT ADLT (ELECTROSURGICAL) ×3
ELECTRODE REM PT RTRN 9FT ADLT (ELECTROSURGICAL) ×1 IMPLANT
GAUZE SPONGE 4X4 12PLY STRL LF (GAUZE/BANDAGES/DRESSINGS) ×3 IMPLANT
GLOVE BIO SURGEON STRL SZ7.5 (GLOVE) ×3 IMPLANT
GLOVE BIOGEL M 6.5 STRL (GLOVE) ×3 IMPLANT
GLOVE BIOGEL PI IND STRL 7.0 (GLOVE) ×1 IMPLANT
GLOVE BIOGEL PI IND STRL 8 (GLOVE) ×1 IMPLANT
GLOVE BIOGEL PI INDICATOR 7.0 (GLOVE) ×2
GLOVE BIOGEL PI INDICATOR 8 (GLOVE) ×2
GLOVE ECLIPSE 6.5 STRL STRAW (GLOVE) ×3 IMPLANT
GOWN STRL REUS W/ TWL LRG LVL3 (GOWN DISPOSABLE) ×2 IMPLANT
GOWN STRL REUS W/TWL LRG LVL3 (GOWN DISPOSABLE) ×6
GOWN STRL REUS W/TWL XL LVL3 (GOWN DISPOSABLE) ×3 IMPLANT
K-WIRE 1.6 (WIRE) ×6
K-WIRE FX5X1.6XNS BN SS (WIRE) ×2
KWIRE FX5X1.6XNS BN SS (WIRE) ×2 IMPLANT
NEEDLE HYPO 25X1 1.5 SAFETY (NEEDLE) IMPLANT
NS IRRIG 1000ML POUR BTL (IV SOLUTION) ×3 IMPLANT
PACK BASIN DAY SURGERY FS (CUSTOM PROCEDURE TRAY) ×3 IMPLANT
PADDING CAST ABS 4INX4YD NS (CAST SUPPLIES) ×2
PADDING CAST ABS COTTON 4X4 ST (CAST SUPPLIES) ×1 IMPLANT
PEG THREADED 2.5MMX22MM LONG (Peg) ×3 IMPLANT
PENCIL SMOKE EVACUATOR (MISCELLANEOUS) ×3 IMPLANT
PLATE XLONG 24.4X89.5 LT (Plate) ×3 IMPLANT
SCREW BN 13X3.5XNS CORT TI (Screw) ×1 IMPLANT
SCREW CORT 3.5X13 (Screw) ×3 IMPLANT
SCREW CORT 3.5X14 LNG (Screw) ×6 IMPLANT
SCREW CORT 3.5X16 LNG (Screw) ×9 IMPLANT
SCREW PEG 2.5X16 NONLOCK (Screw) ×3 IMPLANT
SCREW PEG 2.5X20 NONLOCK (Screw) ×3 IMPLANT
SCREW PEG 2.5X22 NONLOCK (Screw) ×6 IMPLANT
SLEEVE SCD COMPRESS KNEE MED (MISCELLANEOUS) ×3 IMPLANT
SLING ARM FOAM STRAP LRG (SOFTGOODS) ×3 IMPLANT
SPLINT WRIST 10 LEFT UNV (SOFTGOODS) ×3 IMPLANT
STAPLER VISISTAT (STAPLE) ×3 IMPLANT
STOCKINETTE 6  STRL (DRAPES) ×3
STOCKINETTE 6 STRL (DRAPES) ×1 IMPLANT
SUT VIC AB 2-0 CT3 27 (SUTURE) ×3 IMPLANT
SUT VICRYL 4-0 PS2 18IN ABS (SUTURE) IMPLANT
SUT VICRYL RAPIDE 4-0 (SUTURE) IMPLANT
SUT VICRYL RAPIDE 4/0 PS 2 (SUTURE) ×3 IMPLANT
SYR 10ML LL (SYRINGE) IMPLANT
SYR BULB EAR ULCER 3OZ GRN STR (SYRINGE) ×3 IMPLANT
TOWEL GREEN STERILE FF (TOWEL DISPOSABLE) ×3 IMPLANT
UNDERPAD 30X36 HEAVY ABSORB (UNDERPADS AND DIAPERS) ×3 IMPLANT

## 2019-12-12 NOTE — Interval H&P Note (Signed)
History and Physical Interval Note:  12/12/2019 9:13 AM  Fred Mullins  has presented today for surgery, with the diagnosis of LEFT RADIUS FRACTURE.  The various methods of treatment have been discussed with the patient and family. After consideration of risks, benefits and other options for treatment, the patient has consented to  Procedure(s) with comments: OPEN TREATMENT OF LEFT FOREARM FRACTURE (Left) - REGIONAL/MAC as a surgical intervention.  The patient's history has been reviewed, patient examined, no change in status, stable for surgery.  I have reviewed the patient's chart and labs.  Questions were answered to the patient's satisfaction.     Jolyn Nap

## 2019-12-12 NOTE — Transfer of Care (Signed)
Immediate Anesthesia Transfer of Care Note  Patient: Fred Mullins  Procedure(s) Performed: OPEN TREATMENT OF LEFT FOREARM FRACTURE (Left Wrist)  Patient Location: PACU  Anesthesia Type:GA combined with regional for post-op pain  Level of Consciousness: drowsy and patient cooperative  Airway & Oxygen Therapy: Patient Spontanous Breathing and Patient connected to face mask oxygen  Post-op Assessment: Report given to RN and Post -op Vital signs reviewed and stable  Post vital signs: Reviewed and stable  Last Vitals:  Vitals Value Taken Time  BP    Temp    Pulse 70 12/12/19 1151  Resp 16 12/12/19 1151  SpO2 93 % 12/12/19 1151  Vitals shown include unvalidated device data.  Last Pain:  Vitals:   12/12/19 0901  TempSrc: Oral         Complications: No complications documented.

## 2019-12-12 NOTE — Anesthesia Postprocedure Evaluation (Signed)
Anesthesia Post Note  Patient: Fred Mullins  Procedure(s) Performed: OPEN TREATMENT OF LEFT FOREARM FRACTURE (Left Wrist)     Patient location during evaluation: PACU Anesthesia Type: General Level of consciousness: awake and alert Pain management: pain level controlled Vital Signs Assessment: post-procedure vital signs reviewed and stable Respiratory status: spontaneous breathing, nonlabored ventilation, respiratory function stable and patient connected to nasal cannula oxygen Cardiovascular status: blood pressure returned to baseline and stable Postop Assessment: no apparent nausea or vomiting Anesthetic complications: no   No complications documented.  Last Vitals:  Vitals:   12/12/19 1330 12/12/19 1334  BP:  (!) 128/91  Pulse: 68 68  Resp: 16 16  Temp:  36.7 C  SpO2: 99% 99%    Last Pain:  Vitals:   12/12/19 1334  TempSrc: Oral  PainSc: 4                  Sayde Lish

## 2019-12-12 NOTE — Anesthesia Preprocedure Evaluation (Addendum)
Anesthesia Evaluation  Patient identified by MRN, date of birth, ID band Patient awake    Reviewed: Allergy & Precautions, NPO status , Patient's Chart, lab work & pertinent test results  Airway Mallampati: III  TM Distance: >3 FB Neck ROM: Full    Dental  (+) Caps   Pulmonary Current Smoker, former smoker,    Pulmonary exam normal breath sounds clear to auscultation       Cardiovascular negative cardio ROS Normal cardiovascular exam Rhythm:Regular Rate:Normal     Neuro/Psych  Headaches, negative psych ROS   GI/Hepatic negative GI ROS, Neg liver ROS,   Endo/Other    Renal/GU Renal diseaseHx/o Renal calculi  negative genitourinary   Musculoskeletal Left fibula Fx   Abdominal   Peds  Hematology negative hematology ROS (+)   Anesthesia Other Findings   Reproductive/Obstetrics                             Anesthesia Physical  Anesthesia Plan  ASA: II  Anesthesia Plan: General   Post-op Pain Management: GA combined w/ Regional for post-op pain   Induction: Intravenous  PONV Risk Score and Plan: 2  Airway Management Planned: LMA  Additional Equipment:   Intra-op Plan:   Post-operative Plan: Extubation in OR  Informed Consent: I have reviewed the patients History and Physical, chart, labs and discussed the procedure including the risks, benefits and alternatives for the proposed anesthesia with the patient or authorized representative who has indicated his/her understanding and acceptance.     Dental advisory given  Plan Discussed with: CRNA, Anesthesiologist and Surgeon  Anesthesia Plan Comments:        Anesthesia Quick Evaluation

## 2019-12-12 NOTE — Discharge Instructions (Signed)
Discharge Instructions   You have a dressing with a plaster splint incorporated in it. Move your fingers as much as possible, making a full fist and fully opening the fist. Elevate your hand to reduce pain & swelling of the digits.  Ice over the operative site may be helpful to reduce pain & swelling.  DO NOT USE HEAT. Pain medicine has been prescribed for you.  Take Ibuprofen 600 mg and tylenol 650 mg every 6 hours together. Take the Oxycodone 5 mg additionally as a rescue medicine for severe post operative pain. Leave the dressing in place until you return to our office.  You may shower, but keep the bandage clean & dry.  You may drive a car when you are off of prescription pain medications and can safely control your vehicle with both hands. Call our office and schedule an appointment for 8-10 days from the date of surgery.   Please call [phone] during normal business hours or [phone] after hours for any problems. Including the following:  - excessive redness of the incisions - drainage for more than 4 days - fever of more than 101.5 F  *Please note that pain medications will not be refilled after hours or on weekends.  WORK STATUS: No lifting gripping or grasping greater than pencil and paper tasks with the left arm.   OXYCODONE 5 MG given at 12:53.    Post Anesthesia Home Care Instructions  Activity: Get plenty of rest for the remainder of the day. A responsible individual must stay with you for 24 hours following the procedure.  For the next 24 hours, DO NOT: -Drive a car -Advertising copywriter -Drink alcoholic beverages -Take any medication unless instructed by your physician -Make any legal decisions or sign important papers.  Meals: Start with liquid foods such as gelatin or soup. Progress to regular foods as tolerated. Avoid greasy, spicy, heavy foods. If nausea and/or vomiting occur, drink only clear liquids until the nausea and/or vomiting subsides. Call  your physician if vomiting continues.  Special Instructions/Symptoms: Your throat may feel dry or sore from the anesthesia or the breathing tube placed in your throat during surgery. If this causes discomfort, gargle with warm salt water. The discomfort should disappear within 24 hours.  If you had a scopolamine patch placed behind your ear for the management of post- operative nausea and/or vomiting:  1. The medication in the patch is effective for 72 hours, after which it should be removed.  Wrap patch in a tissue and discard in the trash. Wash hands thoroughly with soap and water. 2. You may remove the patch earlier than 72 hours if you experience unpleasant side effects which may include dry mouth, dizziness or visual disturbances. 3. Avoid touching the patch. Wash your hands with soap and water after contact with the patch.    Information for Discharge Teaching: EXPAREL (bupivacaine liposome injectable suspension)   Your surgeon or anesthesiologist gave you EXPAREL(bupivacaine) to help control your pain after surgery.   EXPAREL is a local anesthetic that provides pain relief by numbing the tissue around the surgical site.  EXPAREL is designed to release pain medication over time and can control pain for up to 72 hours.  Depending on how you respond to EXPAREL, you may require less pain medication during your recovery.  Possible side effects:  Temporary loss of sensation or ability to move in the area where bupivacaine was injected. Nausea, vomiting, constipation  Rarely, numbness and tingling in your mouth or lips, lightheadedness,  or anxiety may occur.  Call your doctor right away if you think you may be experiencing any of these sensations, or if you have other questions regarding possible side effects.  Follow all other discharge instructions given to you by your surgeon or nurse. Eat a healthy diet and drink plenty of water or other fluids.  If you return to the hospital for  any reason within 96 hours following the administration of EXPAREL, it is important for health care providers to know that you have received this anesthetic. A teal colored band has been placed on your arm with the date, time and amount of EXPAREL you have received in order to alert and inform your health care providers. Please leave this armband in place for the full 96 hours following administration, and then you may remove the band.     Regional Anesthesia Blocks  1. Numbness or the inability to move the "blocked" extremity may last from 3-48 hours after placement. The length of time depends on the medication injected and your individual response to the medication. If the numbness is not going away after 48 hours, call your surgeon.  2. The extremity that is blocked will need to be protected until the numbness is gone and the  Strength has returned. Because you cannot feel it, you will need to take extra care to avoid injury. Because it may be weak, you may have difficulty moving it or using it. You may not know what position it is in without looking at it while the block is in effect.  3. For blocks in the legs and feet, returning to weight bearing and walking needs to be done carefully. You will need to wait until the numbness is entirely gone and the strength has returned. You should be able to move your leg and foot normally before you try and bear weight or walk. You will need someone to be with you when you first try to ensure you do not fall and possibly risk injury.  4. Bruising and tenderness at the needle site are common side effects and will resolve in a few days.  5. Persistent numbness or new problems with movement should be communicated to the surgeon or the [facility] [phone] [facility] [phone]).

## 2019-12-12 NOTE — Anesthesia Procedure Notes (Signed)
Anesthesia Regional Block: Supraclavicular block   Pre-Anesthetic Checklist: ,, timeout performed, Correct Patient, Correct Site, Correct Laterality, Correct Procedure, Correct Position, site marked, Risks and benefits discussed,  Surgical consent,  Pre-op evaluation,  At surgeon's request and post-op pain management  Laterality: Left  Prep: chloraprep       Needles:  Injection technique: Single-shot  Needle Type: Echogenic Stimulator Needle     Needle Length: 5cm  Needle Gauge: 22     Additional Needles:   Procedures:, nerve stimulator,,, ultrasound used (permanent image in chart),,,,   Nerve Stimulator or Paresthesia:  Response: hand, 0.45 mA,   Additional Responses:   Narrative:  Start time: 12/12/2019 9:32 AM End time: 12/12/2019 9:37 AM Injection made incrementally with aspirations every 5 mL.  Performed by: Personally  Anesthesiologist: Bethena Midget, MD  Additional Notes: Functioning IV was confirmed and monitors were applied.  A 41mm 22ga Arrow echogenic stimulator needle was used. Sterile prep and drape,hand hygiene and sterile gloves were used. Ultrasound guidance: relevant anatomy identified, needle position confirmed, local anesthetic spread visualized around nerve(s)., vascular puncture avoided.  Image printed for medical record. Negative aspiration and negative test dose prior to incremental administration of local anesthetic. The patient tolerated the procedure well.

## 2019-12-12 NOTE — Progress Notes (Signed)
Assisted Dr. Oddono with left, ultrasound guided, supraclavicular block. Side rails up, monitors on throughout procedure. See vital signs in flow sheet. Tolerated Procedure well. 

## 2019-12-12 NOTE — Interval H&P Note (Signed)
History and Physical Interval Note:  12/12/2019 10:13 AM  Fred Mullins  has presented today for surgery, with the diagnosis of LEFT RADIUS FRACTURE.  The various methods of treatment have been discussed with the patient and family. After consideration of risks, benefits and other options for treatment, the patient has consented to  Procedure(s) with comments: OPEN TREATMENT OF LEFT FOREARM FRACTURE (Left) - REGIONAL/MAC as a surgical intervention.  The patient's history has been reviewed, patient examined, no change in status, stable for surgery.  I have reviewed the patient's chart and labs.  Questions were answered to the patient's satisfaction.     Jolyn Nap

## 2019-12-12 NOTE — Op Note (Addendum)
12/12/2019  11:59 AM  PATIENT:  Fred Mullins  44 y.o. male  PRE-OPERATIVE DIAGNOSIS: Comminuted left distal radius diametaphyseal fracture with intra-articular extension  POST-OPERATIVE DIAGNOSIS:  Same  PROCEDURE: ORIF left distal radius fracture, 25956  SURGEON: Rayvon Char. Grandville Silos, MD  PHYSICIAN ASSISTANT: Morley Kos, OPA-C  ANESTHESIA:  regional and MAC  SPECIMENS:  None  DRAINS: None  EBL:  less than 50 mL  PREOPERATIVE INDICATIONS:  [name] is a  43 y.o. male with a comminuted fracture of the left radius and the diametaphyseal region.  The risks benefits and alternatives were discussed with the patient preoperatively including but not limited to the risks of infection, bleeding, nerve injury, cardiopulmonary complications, the need for revision surgery, among others, and the patient verbalized understanding and consented to proceed.  OPERATIVE IMPLANTS: Biomet DVR extended plate  OPERATIVE PROCEDURE: After receiving prophylactic antibiotics and a regional block, the patient was escorted to the operative theatre and placed in a supine position.   A surgical "time-out" was performed during which the planned procedure, proposed operative site, and the correct patient identity were compared to the operative consent and agreement confirmed by the circulating nurse according to current facility policy. Following application of a tourniquet to the operative extremity, the exposed skin was pre-scrubbed with Hibiclens scrub brush and then was prepped with Chloraprep and draped in the usual sterile fashion. The limb was exsanguinated with an Esmarch bandage and the tourniquet inflated to approximately 154mHg higher than systolic BP.   A linear incision was marked and made over the FCR axis and the distal forearm, coursing in a zigzag at the level of the wrist creases.. The skin was incised sharply with scalpel, subcutaneous tissues with blunt and spreading dissection. The  FCR axis was exploited deeply. The pronator quadratus was reflected in an L-shaped ulnarly.  The fracture was inspected.  There was a zone of comminution in the distal diaphyseal region, but a sagittal plane split into the distal fragment, all the way to the joint surface, making it actually an intra-articular fx, but it was not displaced.  Decision was made to proceed with use of the DVR implant rather than a small fragment plate.  The fracture was provisionally reduced and held with clamps.  Acceptability of reduction was confirmed fluoroscopically. The appropriately sized plate was selected and found to fit well. It was placed in its provisional alignment of the radius and this was confirmed fluoroscopically.  It was secured to the radius with provisional K wire fixation.  Additional adjustments were made as necessary, and the distal holes were all drilled and filled.  Peg/screw length distally was selected on the shorter side of measurements to minimize the risk for dorsal cortical penetration. The remainder of the proximal holes were drilled and filled.  At 1 point, there was some adjustments made to help contain some of the dorsal comminution at the fracture site better.  Final images were obtained and the DRUJ was examined for stability. It was found to be sufficiently stable. The wound was then copiously irrigated and the PQ was repaired with 2-0 Vicryl interrupted suture.  Tourniquet was released and additional hemostasis obtained and the skin was closed with 2-0 Vicryl deep dermal buried sutures followed by running 4-0 Vicryl Rapide horizontal mattress suture in the skin more distally, staples more proximally. A bulky dressing  was applied, placing a removable wrist splint over it, and the patient was taken to the recovery room in stable condition.  DISPOSITION: The patient  will be discharged home today with typical post-op instructions, returning in 10-15 days for reevaluation with new x-rays of the  affected wrist out of the splint to include an inclined lateral, before returning to his removable wrist splint.

## 2019-12-12 NOTE — Anesthesia Procedure Notes (Signed)
Procedure Name: LMA Insertion Date/Time: 12/12/2019 10:46 AM Performed by: Sheryn Bison, CRNA Pre-anesthesia Checklist: Patient identified, Emergency Drugs available, Suction available and Patient being monitored Patient Re-evaluated:Patient Re-evaluated prior to induction Oxygen Delivery Method: Circle system utilized Preoxygenation: Pre-oxygenation with 100% oxygen Induction Type: IV induction Ventilation: Mask ventilation without difficulty LMA: LMA inserted LMA Size: 4.0 Number of attempts: 1 Airway Equipment and Method: Bite block Placement Confirmation: positive ETCO2 Tube secured with: Tape Dental Injury: Teeth and Oropharynx as per pre-operative assessment

## 2019-12-13 ENCOUNTER — Encounter (HOSPITAL_BASED_OUTPATIENT_CLINIC_OR_DEPARTMENT_OTHER): Payer: Self-pay | Admitting: Orthopedic Surgery

## 2020-07-13 ENCOUNTER — Encounter: Payer: Self-pay | Admitting: Internal Medicine

## 2020-07-13 ENCOUNTER — Other Ambulatory Visit: Payer: Self-pay | Admitting: Internal Medicine

## 2020-07-13 ENCOUNTER — Other Ambulatory Visit: Payer: Self-pay

## 2020-07-13 ENCOUNTER — Ambulatory Visit: Payer: MEDICAID | Attending: Internal Medicine | Admitting: Internal Medicine

## 2020-07-13 VITALS — BP 138/78 | HR 90 | Resp 16 | Ht 67.0 in | Wt 217.4 lb

## 2020-07-13 DIAGNOSIS — R945 Abnormal results of liver function studies: Secondary | ICD-10-CM

## 2020-07-13 DIAGNOSIS — F17210 Nicotine dependence, cigarettes, uncomplicated: Secondary | ICD-10-CM

## 2020-07-13 DIAGNOSIS — R030 Elevated blood-pressure reading, without diagnosis of hypertension: Secondary | ICD-10-CM

## 2020-07-13 DIAGNOSIS — K625 Hemorrhage of anus and rectum: Secondary | ICD-10-CM

## 2020-07-13 DIAGNOSIS — R7989 Other specified abnormal findings of blood chemistry: Secondary | ICD-10-CM

## 2020-07-13 DIAGNOSIS — Z1159 Encounter for screening for other viral diseases: Secondary | ICD-10-CM

## 2020-07-13 DIAGNOSIS — Z0000 Encounter for general adult medical examination without abnormal findings: Secondary | ICD-10-CM

## 2020-07-13 DIAGNOSIS — Z23 Encounter for immunization: Secondary | ICD-10-CM | POA: Insufficient documentation

## 2020-07-13 DIAGNOSIS — E669 Obesity, unspecified: Secondary | ICD-10-CM | POA: Insufficient documentation

## 2020-07-13 DIAGNOSIS — H5203 Hypermetropia, bilateral: Secondary | ICD-10-CM

## 2020-07-13 DIAGNOSIS — Z114 Encounter for screening for human immunodeficiency virus [HIV]: Secondary | ICD-10-CM

## 2020-07-13 MED ORDER — HYDROCORTISONE ACETATE 25 MG RE SUPP: 25.0000 mg | Freq: Two times a day (BID) | RECTAL | 1 refills | Status: DC | PRN

## 2020-07-13 MED FILL — HYDROCORTISONE ACETATE 25 M: 25 | 6 days supply | Qty: 12 | Fill #0

## 2020-07-13 NOTE — Patient Instructions (Addendum)
Alimentacin saludable Healthy Eating Seguir una modalidad de alimentacin saludable puede ayudarlo a Science writer y Theatre manager un peso saludable, reducir el riesgo de tener enfermedades crnicas y vivir Ardelia Mems vida larga y productiva. Es importante que siga una modalidad de alimentacin saludable con un nivel adecuado de caloras para su cuerpo. Debe cubrir sus necesidades nutricionales principalmente a travs de los alimentos, escogiendo una variedad de alimentos ricos en nutrientes. Cules son algunos consejos para seguir este plan? Lea las etiquetas de los alimentos  Lea las etiquetas y elija las que digan lo siguiente: ? Reducido en sodio o con bajo contenido de [place]. ? Jugos con 100% jugo de fruta. ? Alimentos con bajo contenido de grasas saturadas y alto contenido de grasas poliinsaturadas y Veterinary surgeon. ? Alimentos con cereales integrales, como trigo integral, trigo partido, arroz integral y arroz salvaje. ? Cereales integrales fortificados con cido flico. Se recomienda a las mujeres embarazadas o que desean quedar embarazadas.  Lea las etiquetas y evite: ? Los alimentos con una gran cantidad de Nurse, learning disability. Estos incluyen los alimentos que contienen azcar moreno, endulzante a base de maz, jarabe de maz, dextrosa, fructosa, glucosa, jarabe de maz de alta fructosa, miel, azcar invertido, lactosa, jarabe de [place], maltosa, [place], azcar sin refinar, sacarosa, trehalosa y azcar turbinado.  No consuma ms que las siguientes cantidades de azcar agregada por da:  6 cucharaditas (25 g) las mujeres.  9 cucharaditas (38 g) los hombres. ? Los alimentos que contienen almidones y cereales refinados o procesados. ? Los productos de cereales refinados, como harina blanca, harina de maz desgerminada, pan blanco y arroz blanco. Al ir de compras  Elija refrigerios ricos en nutrientes, como verduras, frutas enteras y frutos secos. Evite los refrigerios con alto contenido de caloras y  Location manager, como las papas fritas, los refrigerios frutales y los caramelos.  Use alios y productos para untar a base de aceite con los Building surveyor de grasas slidas como la [place], la margarina en barra o el queso crema.  Limite las salsas, las mezclas y los productos "instantneos" preelaborados como el arroz saborizado, los fideos instantneos y las pastas listas para comer.  Pruebe ms fuentes de protena vegetal, como tofu, tempeh, frijoles negros, edamame, lentejas, frutos secos y semillas.  Explore planes de alimentacin como la dieta mediterrnea o la dieta vegetariana. Al cocinar  Use aceite para Lobbyist de grasas slidas como [place], margarina en barra o [place] de cerdo.  En lugar de frer, trate de cocinar en el horno, en la plancha o en la parrilla, o hervir los alimentos.  Retire la parte grasa de las carnes antes de cocinarlas.  Cocine las verduras al vapor en agua o caldo. Planificacin de las comidas  En las comidas, imagine dividir su plato en cuartos: ? La mitad del plato tiene frutas y verduras. ? Un cuarto del plato tiene cereales integrales. ? Un cuarto del plato tiene protena, especialmente carnes [place], aves, huevos, tofu, frijoles o frutos secos.  Incluya lcteos descremados en su dieta diaria.   Estilo de vida  Elija opciones saludables en todos los mbitos, como en el hogar, el [place], la [place], los restaurantes y [place].  Prepare los alimentos de un modo seguro: ? Lvese las manos despus de manipular carnes crudas. ? [place] de preparacin de los alimentos limpias lavndolas regularmente con agua caliente y [place]. ? Mantenga las carnes crudas separadas de los alimentos que estn listos para comer como las frutas y las verduras. ?  Cocine los frutos de mar, carnes, aves y huevos hasta alcanzar la temperatura interna recomendada. ? Almacene los alimentos a temperaturas seguras. En  general:  Mantenga los alimentos fros a una temperatura de 40F (4,4C) o inferior.  Mantenga los alimentos calientes a una temperatura de 140F (60C) o superior.  Mantenga el congelador a una temperatura de 0 F (-17,8C) o inferior.  Los alimentos dejan de ser seguros para su consumo cuando han estado a una temperatura de entre 40 y 140F (4,4 y 60C) por ms de 2horas. Qu alimentos debo consumir? Frutas Propngase comer el equivalente a 2tazas de frutas frescas, enlatadas (en su jugo natural) o congeladas cada da. Algunos ejemplos de equivalentes a 1taza de frutas son 1manzana pequea, 8fresas grandes, 1taza de fruta enlatada, taza de fruta desecada o 1 taza de jugo 100%. Verduras Propngase comer el equivalente a 2 o 3tazas de verduras frescas y congeladas cada da, incluyendo diferentes variedades y colores. Algunos ejemplos de equivalentes a 1taza de verduras son 2zanahorias medianas, 2 tazas de verduras de hoja verde crudas, 1taza de verduras cortadas (crudas o cocidas) o 1papa mediana al horno. Granos Propngase comer el equivalente a 6onzas de cereales integrales por da. Algunos ejemplos de equivalentes a 1onza de cereales son 1rebanada de pan, 1taza de cereal listo para comer, 3tazas de palomitas de maz o  taza de arroz, pasta o cereales cocidos. Carnes y otras protenas Propngase comer el equivalente a 5 o 6onzas de protena por da. Algunos ejemplos de equivalentes a 1 onza de protena son 1huevo, taza de frutos secos o semillas o 1 cucharada (16g) de mantequilla de man. Un corte de carne o pescado del tamao de un mazo de cartas equivale aproximadamente a 3 a 4 onzas.  De las protenas que consume cada semana, intente que al menos 8onzas provengan de frutos de mar. Esto incluye el salmn, la trucha, el arenque y las anchoas. Lcteos Propngase comer el equivalente a 3tazas de lcteos descremados o con bajo contenido de grasa cada da.  Algunos ejemplos de equivalentes a 1taza de lcteos son 1taza (240ml) de leche, 8onzas (250g) de yogur, 1onzas (44g) de queso natural o 1 taza (240ml) de leche de soja fortificada. Grasas y aceites  Propngase consumir alrededor de 5 cucharaditas (21g) por da. Elija grasas monoinsaturadas, como el aceite de canola y de oliva, aguacate, mantequilla de man y la mayora de los frutos secos, o bien grasas poliinsaturadas, como el aceite de girasol, maz y soja, nueces, piones, semillas de ssamo, semillas de girasol y semillas de lino. Bebidas  Propngase beber seis vasos de 8 onzas de agua por da. Limite el caf a entre tres y cinco tazas de 8 onzas por da.  Limite el consumo de bebidas con cafena que tengan caloras agregadas, como los refrescos y las bebidas energizantes.  Limite el consumo de alcohol a no ms de 1medida por da si es mujer y no est embarazada, y a 2medidas por da si es hombre. Una medida equivale a 12onzas de cerveza (355ml), 5onzas de vino (148ml) o 1onzas de bebidas alcohlicas de alta graduacin (44ml). Condimentos y otros alimentos  Eviteagregar cantidades excesivas de sal a los alimentos. Pruebe darles sabor con hierbas y especias en lugar de sal.  Evite agregar azcar a los alimentos.  Pruebe usar alios, salsas y productos untables a base de aceite en lugar de grasas slidas. Esta informacin se basa en las pautas generales de nutricin de los [place]. Para obtener ms informacin, visite choosemyplate.gov. Las   cantidades exactas pueden variar en funcin de sus necesidades nutricionales. Resumen  Un plan de alimentacin saludable puede ayudarlo a mantener un peso saludable, reducir el riesgo de tener enfermedades crnicas y [place] activo durante toda su vida.  Planifique sus comidas. Asegrese de consumir las porciones correctas de una variedad de alimentos ricos en nutrientes.  En lugar de frer, trate de cocinar en el horno, en la  plancha o en la parrilla, o hervir los alimentos.  Elija opciones saludables en todos los mbitos, como en el hogar, el trabajo, la [place], los restaurantes y [place]. Esta informacin no tiene Theme park manager el consejo del mdico. Asegrese de hacerle al mdico cualquier pregunta que tenga. Document Revised: 10/12/2017 Document Reviewed: 10/12/2017 Elsevier Patient Education  2021 Elsevier Inc.  Influenza Virus Vaccine injection (Fluarix) Qu es este medicamento? La VACUNA ANTIGRIPAL ayuda a disminuir el riesgo de contraer la influenza, tambin conocida como la gripe. La vacuna solo ayuda a protegerle contra algunas cepas de influenza. Esta vacuna no ayuda a reducir Nurse, adult de contraer influenza pandmica H1N1. Este medicamento puede ser utilizado para otros usos; si tiene alguna pregunta consulte con su proveedor de atencin mdica o con su farmacutico. MARCAS COMUNES: Fluarix, Fluzone Qu le debo informar a mi profesional de la salud antes de tomar este medicamento? Necesita saber si usted presenta alguno de los siguientes problemas o situaciones:  trastorno de sangrado como hemofilia  fiebre o infeccin  sndrome de Guillain-Barre u otros problemas neurolgicos  problemas del sistema inmunolgico  infeccin por el virus de la inmunodeficiencia humana (VIH) o SIDA  niveles bajos de plaquetas en la sangre  esclerosis mltiple  una reaccin Counselling psychologist o inusual a las vacunas antigripales, a los huevos, protenas de pollo, al ltex, a la gentamicina, a otros medicamentos, alimentos, colorantes o conservantes  si est embarazada o buscando quedar embarazada  si est amamantando a un beb Cmo debo [employer]? Esta vacuna se administra mediante inyeccin por va intramuscular. Lo administra un profesional de [employer]. Recibir una copia de informacin escrita sobre la vacuna antes de cada vacuna. Asegrese de leer este folleto cada vez cuidadosamente.  Este folleto puede cambiar con frecuencia. Hable con su pediatra para informarse acerca del uso de este medicamento en nios. Puede requerir atencin especial. Sobredosis: Pngase en contacto inmediatamente con un centro toxicolgico o una sala de urgencia si usted cree que haya tomado demasiado medicamento. ATENCIN: [employer] es solo para usted. No comparta este medicamento con nadie. Qu sucede si me olvido de una dosis? No se aplica en este caso. Qu puede interactuar con este medicamento?  quimioterapia o radioterapia  medicamentos que suprimen el sistema inmunolgico, tales como etanercept, anakinra, infliximab y adalimumab  medicamentos que tratan o previenen cogulos sanguneos, como warfarina  fenitona  medicamentos esteroideos, como la prednisona o la cortisona  teofilina  vacunas Puede ser que esta lista no menciona todas las posibles interacciones. Informe a su profesional de [employer] de [employer] productos a base de hierbas, medicamentos de [place] o suplementos nutritivos que est tomando. Si usted fuma, consume bebidas alcohlicas o si utiliza drogas ilegales, indqueselo tambin a su profesional de [employer]. Algunas sustancias pueden interactuar con su medicamento. A qu debo estar atento al usar [employer]? Informe a su mdico o a Producer, television/film/video de la [employer] todos los efectos secundarios que persistan despus de [address]. Llame a su proveedor de atencin mdica si se presentan sntomas inusuales dentro de las  6 semanas posteriores a la vacunacin. Es posible que todava pueda contraer la gripe, pero la enfermedad no ser tan fuerte como normalmente. No puede contraer la gripe de esta vacuna. La vacuna antigripal no le protege contra resfros u otras enfermedades que pueden causar [place]. Debe vacunarse cada ao. Qu efectos secundarios puedo tener al [employer] este medicamento? Efectos secundarios que debe informar a su mdico o a Producer, television/film/video de  la salud tan pronto como sea posible:  Therapist, art como erupcin cutnea, picazn o urticarias, hinchazn de la cara, labios o lengua Efectos secundarios que, por lo general, no requieren atencin mdica (debe informarlos a su mdico o a su profesional de la salud si persisten o si son molestos):  fiebre  dolor de cabeza  molestias y Art gallery manager, sensibilidad, enrojecimiento o Paramedic de la inyeccin  cansancio o debilidad Puede ser que esta lista no menciona todos los posibles efectos secundarios. Comunquese a su mdico por asesoramiento mdico [employer]. Usted puede informar los efectos secundarios a la FDA por telfono al 1-800-FDA-1088. Dnde debo guardar mi medicina? Esta vacuna se administra solamente en clnicas, farmacias, consultorio mdico u otro consultorio de un profesional de la salud y no Teacher, early years/pre en su domicilio. ATENCIN: Este folleto es un resumen. Puede ser que no cubra toda la posible informacin. Si usted tiene preguntas acerca de esta medicina, consulte con su mdico, su farmacutico o su profesional de Radiographer, therapeutic.  2021 Elsevier/Gold Standard (2009-11-20 15:31:40)   Hemorroides Hemorrhoids Las hemorroides son venas inflamadas que pueden desarrollarse:  En el ano (recto). Estas se denominan hemorroides internas.  Alrededor de la abertura del ano. Estas se denominan hemorroides externas. Las hemorroides pueden causar dolor, picazn o hemorragias. Generalmente no causan problemas graves. Con frecuencia mejoran al [employer] dieta, el estilo de vida y otros tratamientos Facilities manager. Cules son las causas? Esta afeccin puede ser causada por lo siguiente:  Tener dificultad para defecar (estreimiento).  Hacer mucha fuerza (esfuerzo) para defecar.  Materia fecal lquida (diarrea).  Embarazo.  Tener mucho sobrepeso (obesidad).  Estar sentado durante largos perodos de [place].  Levantar  objetos pesados u otras actividades que impliquen esfuerzo.  Sexo anal.  Andar en bicicleta por un largo perodo de tiempo. Cules son los signos o los sntomas? Los sntomas de esta afeccin incluyen los siguientes:  Engineer, mining.  Picazn o irritacin en el ano.  Sangrado proveniente del ano.  Prdida de materia fecal.  Inflamacin en la zona.  Uno o ms bultos alrededor de la abertura del ano. Cmo se diagnostica? A menudo un mdico puede diagnosticar esta afeccin al observar la zona afectada. El mdico tambin puede:  Education officer, environmental un examen que implica palpar la zona con la mano enguantada (examen rectal digital).  Examinar el interior de la zona anal utilizando un pequeo tubo (anoscopio).  Pedir anlisis de [place]. Es posible que esto se realice si ha perdido Warden/ranger.  Solicitarle un estudio que consiste en la observacin del interior del colon utilizando un tubo flexible con una cmara en el extremo (sigmoidoscopia o colonoscopa). Cmo se trata? Esta afeccin generalmente se puede tratar en el hogar. El mdico puede indicarle que cambie de Paediatric nurse, de estilo de vida o que trate de Psychologist, sport and exercise. Si esto no da resultado, se pueden realizar procedimientos para extirpar las hemorroides o reducir Brewing technologist. Estos pueden implicar lo siguiente:  Museum/gallery conservator en la base de las hemorroides para interrumpir  la irrigacin de Risk manager.  Inyectar un medicamento en las hemorroides para reducir Brewing technologist.  Dirigir un tipo de Engineer, drilling de luz hacia las hemorroides para Radio producer que se caigan.  Realizar Neomia Dear ciruga para extirpar las hemorroides o cortar la irrigacin de [place]. Siga estas indicaciones en su casa: Comida y bebida  Consuma alimentos con alto contenido de [place]. Entre ellos cereales integrales, frijoles, frutos secos, frutas y verduras.  Pregntele a su mdico acerca de tomar productos con fibra aadida en ellos (complementos  defibra).  Disminuya la cantidad de grasa de la dieta. Para esto, puede hacer lo siguiente: ? Coma productos lcteos descremados. ? Coma menos carne roja. ? No consuma alimentos procesados.  Beba suficiente lquido para Photographer orina de color amarillo plido.   Control del dolor y la hinchazn  Tome un bao de agua tibia (bao de asiento) durante 20 minutos para Engineer, materials. Hgalo 3 o 4veces al da. Puede hacer esto en una baera o usar un dispositivo porttil para bao de asiento que se coloca sobre el inodoro.  Si se lo indican, aplique hielo sobre la zona dolorida. Puede ser beneficioso aplicar hielo [employer] baos con agua tibia. ? Ponga el hielo en una bolsa plstica. ? Coloque una [employer] piel y Copy. ? Coloque el hielo durante , 2 a 3veces por da.   Indicaciones generales  [employer] de venta libre y los recetados solamente como se lo haya indicado el mdico. ? [facility] [employer] y los medicamentos pueden usarse como se lo hayan indicado.  Haga ejercicio fsico con frecuencia. Consulte al mdico qu tipos de ejercicios son mejores para usted y qu cantidad.  Vaya al bao cuando sienta ganas de defecar. No espere.  Evite hacer demasiada fuerza al defecar.  Mantenga el ano seco y limpio. Use papel higinico hmedo o toallitas humedecidas despus de defecar.  No pase mucho tiempo sentado en el inodoro.  Concurra a todas las visitas de [address] se lo haya indicado el mdico. Esto es importante. Comunquese con un mdico si:  Tiene dolor e hinchazn que no mejoran con el tratamiento o los medicamentos.  Tiene problemas para defecar.  No puede defecar.  Tiene dolor o hinchazn en la zona exterior de las hemorroides. Solicite ayuda inmediatamente si tiene:  Hemorragia que no se detiene. Resumen  Las hemorroides son venas hinchadas en el ano o la zona que rodea el ano.  Pueden causar dolor, picazn o  sangrado.  Consuma alimentos con alto contenido de [place]. Entre ellos cereales integrales, frijoles, frutos secos, frutas y verduras.  Tome un bao de agua tibia (bao de asiento) durante 20 minutos para Engineer, materials. Hgalo 3 o 4veces al da. Esta informacin no tiene Theme park manager el consejo del mdico. Asegrese de hacerle al mdico cualquier pregunta que tenga. Document Revised: 11/26/2017 Document Reviewed: 11/26/2017 Elsevier Patient Education  2021 [employer].

## 2020-07-13 NOTE — Progress Notes (Signed)
Patient ID: Fred Mullins, male    DOB: July 11, 1975  MRN: [id number]  CC: Annual Exam and New Patient (Initial Visit)   Subjective: Fred Mullins is a 45 y.o. male who presents for new pt visit/annual.  Wife is on pt's cell phone. Pt request that his wife interprets instead of [place] using [employer] interpre[employer]. States he feels more comfortable with his spouse interpreting. His concerns today include:  Patient with history of hemorrhoids, vitamin D deficiency, hyperTG  Pt c/o rectal bleeding intermittently x 1 yr. Occurs "once in a while about once or twice a mth."  Associated with rectal pain and itching  Reports BMs are not hard and he does not associate symptoms with BM being hard or soft.  No fhx of colon CA.  No wgh loss.  Hx of hemorrhoids on chart.  Obesity:  Gained 6 lbs since 12/2019.  Not getting in much exercise.  Eating habits poor  C/o blurred vision. Problems seeing things close up. Would like referral for eye exam  Reports being a smoker. Smokes 1 or 2 cigarettes several times a month when hanging out with his friends. He drinks about 16 cans of beer on the weekends. Several months because he had injured his and had surgery several months back. Plans to return to work near future.  HM: had one shot of Pfizer 3 mths ago. Had fever and headache with first shot. Wonders if he will have same if he gets 2nd shot.   Agrees to get flu shot.  Patient Active Problem List   Diagnosis Date Noted  . Influenza vaccine needed 07/13/2020  . Obesity (BMI 30.0-34.9) 07/13/2020  . Elevated blood pressure reading without diagnosis of hypertension 07/13/2020  . Family history of diabetes mellitus 11/04/2013  . Hypertriglyceridemia 11/04/2013  . Unspecified vitamin D deficiency 11/04/2013  . Hemorrhoid 11/04/2013  . Unspecified constipation 11/04/2013  . Dental caries 12/16/2012  . 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 Allergic rhinitis 12/15/2012  . Chronic  headaches 12/15/2012     No current outpatient medications on file prior to visit.   No current facility-administered medications on file prior to visit.    Allergies  Allergen Reactions  . Pollen Extract Other (See Comments)    Unknown    Social History   Socioeconomic History  . Marital status: Single    Spouse name: Not on file  . Number of children: 4  . Years of education: Not on file  . Highest education level: Not on file  Occupational History  . Not on file  Tobacco Use  . Smoking status: Light Tobacco Smoker    Types: Cigarettes  . Smokeless tobacco: Never Used  Vaping Use  . Vaping Use: Never used  Substance and Sexual Activity  . Alcohol use: Yes    Comment: occasonally  . Drug use: No  . Sexual activity: Not on file  Other Topics Concern  . Not on file  Social History Narrative  . Not on file   Social Determinants of Health   Financial Resource Strain: Not on file  Food Insecurity: Not on file  Transportation Needs: Not on file  Physical Activity: Not on file  Stress: Not on file  Social Connections: Not on file  Intimate Partner Violence: Not on file    Family History  Problem Relation Age of Onset  . Hyperlipidemia Mother   . Diabetes Father     Past Surgical History:  Procedure Laterality Date  . FOOT  SURGERY     cyst removed from foot as a child  . ORIF ANKLE FRACTURE Left 10/26/2015   Procedure: OPEN REDUCTION INTERNAL FIXATION (ORIF) LEFT FIBULA FRACTURE;  Surgeon: Newt Minion, MD;  Location: [facility];  Service: Orthopedics;  Laterality: Left;  . ORIF WRIST FRACTURE Left 12/12/2019   Procedure: OPEN TREATMENT OF LEFT FOREARM FRACTURE;  Surgeon: Milly Jakob, MD;  Location: [facility];  Service: Orthopedics;  Laterality: Left;  REGIONAL/MAC    ROS: Review of Systems Negative except as stated above  PHYSICAL EXAM: BP 138/78   Pulse 90   Resp 16   Ht _0  (1.702 m)   Wt 217 lb 6.4 oz (98.6 kg)   SpO2 98%    BMI 34.05 kg/m   Wt Readings from Last 3 Encounters:  07/13/20 217 lb 6.4 oz (98.6 kg)  12/12/19 211 lb 10.3 oz (96 kg)  12/10/19 195 lb (88.5 kg)    Physical Exam  General appearance - alert, well appearing, and in no distress Mental status - normal mood, behavior, speech, dress, motor activity, and thought processes Eyes - pupils equal and reactive, extraocular eye movements intact Ears - bilateral TM's and external ear canals normal Nose - normal and patent, no erythema, discharge or polyps Mouth - mucous membranes moist, pharynx normal without lesions Neck - supple, no significant adenopathy Lymphatics - no palpable lymphadenopathy, no hepatosplenomegaly Chest - clear to auscultation, no wheezes, rales or rhonchi, symmetric air entry Heart - normal rate, regular rhythm, normal S1, S2, no murmurs, rubs, clicks or gallops Abdomen - soft, nontender, nondistended, no masses or organomegaly Rectal exam: pt declined rectal exam Neurological - alert, oriented, normal speech, no focal findings or movement disorder noted Musculoskeletal - no joint tenderness, deformity or swelling Extremities - peripheral pulses normal, no pedal edema, no clubbing or cyanosis  CMP Latest Ref Rng & Units 12/06/2019 06/30/2016 10/26/2015  Glucose 70 - 99 mg/dL 120(H) 92 105(H)  BUN 6 - 20 mg/dL _1 Creatinine 0.61 - 1.24 mg/dL 0.85 0.66 0.75  Sodium 135 - 145 mmol/L 139 137 138  Potassium 3.5 - 5.1 mmol/L 3.9 4.4 4.1  Chloride 98 - 111 mmol/L 101 104 103  CO2 22 - 32 mmol/L _2 Calcium 8.9 - 10.3 mg/dL 9.2 9.3 9.7  Total Protein 6.5 - 8.1 g/dL 7.5 - -  Total Bilirubin 0.3 - 1.2 mg/dL 0.8 - -  Alkaline Phos 38 - 126 U/L 78 - -  AST 15 - 41 U/L 45(H) - -  ALT 0 - 44 U/L 71(H) - -   Lipid Panel     Component Value Date/Time   CHOL 175 06/19/2014 1223   TRIG 280 (H) 06/19/2014 1223   HDL 39 (L) 06/19/2014 1223   CHOLHDL 4.5 06/19/2014 1223   VLDL 56 (H) 06/19/2014 1223   LDLCALC 80  06/19/2014 1223    CBC    Component Value Date/Time   WBC 6.8 12/06/2019 2241   RBC 4.93 12/06/2019 2241   HGB 14.5 12/06/2019 2241   HCT 43.0 12/06/2019 2241   PLT 199 12/06/2019 2241   MCV 87.2 12/06/2019 2241   MCH 29.4 12/06/2019 2241   MCHC 33.7 12/06/2019 2241   RDW 12.6 12/06/2019 2241   LYMPHSABS 3.0 06/30/2016 1829   MONOABS 0.7 06/30/2016 1829   EOSABS 0.2 06/30/2016 1829   BASOSABS 0.0 06/30/2016 1829   Depression screen PHQ 2/9 07/13/2020 01/28/2018 10/02/2016  Decreased Interest 0  1 0  Down, Depressed, Hopeless 0 1 0  PHQ - 2 Score 0 2 0  Altered sleeping - 3 -  Tired, decreased energy - 3 -  Change in appetite - 2 -  Feeling bad or failure about yourself  - 1 -  Trouble concentrating - 0 -  Moving slowly or fidgety/restless - 0 -  Suicidal thoughts - 0 -  PHQ-9 Score - 11 -   ASSESSMENT AND PLAN: 1. Annual physical exam   2. Rectal bleeding Likely due to hemorrhoids.  Discussed management.  Rec keeping BM soft and regular Rec increase fiber intact Advised to apply for the orange card/cone discount card.  Once approved he will let me know and we will submit referral to the gastroenterologist. - hydrocortisone (ANUSOL-HC) 25 MG suppository; Place 1 suppository (25 mg total) rectally 2 (two) times daily as needed for hemorrhoids or anal itching.  Dispense: 12 suppository; Refill: 1  3. Obesity (BMI 30.0-34.9) Discussed the importance of healthy eating habits and regular exercise 20 information given in Spanish and healthy eating habits. - CBC with Differential/Platelet - Comprehensive metabolic panel - Hemoglobin A1c - Lipid panel  4. Elevated blood pressure reading without diagnosis of hypertension DASH diet discussed and encouraged.  Follow-up with clinical pharmacist in 1 week blood pressure check.  5. Influenza vaccine needed 6. Need for immunization against influenza - Flu Vaccine QUAD 36+ mos IM  7. Hyperopia of both eyes Recommend having eye  exam   8. Light smoker Advised to quit smoking.  Health risks associated with smoking discussed.  Patient states that he will quit.  Less than 5 minutes spent on counseling.  I also discussed with him how much is too much alcohol in 1 setting for a male.  Should not drink no more than 2 standard drinks per day.  Encouraged him to cut out the binge drinking that he has been doing on the weekends.  9. Screening for HIV (human immunodeficiency virus) - HIV Antibody (routine testing w rflx)  10. Need for hepatitis C screening test - Hepatitis C Antibody   Patient was given the opportunity to ask questions.  Patient verbalized understanding of the plan and was able to repeat key elements of the plan.   Orders Placed This Encounter  Procedures  . Flu Vaccine QUAD 36+ mos IM  . CBC with Differential/Platelet  . Comprehensive metabolic panel  . Hemoglobin A1c  . Lipid panel  . HIV Antibody (routine testing w rflx)  . Hepatitis C Antibody     Requested Prescriptions   Signed Prescriptions Disp Refills  . hydrocortisone (ANUSOL-HC) 25 MG suppository 12 suppository 1    Sig: Place 1 suppository (25 mg total) rectally 2 (two) times daily as needed for hemorrhoids or anal itching.    Return for Give appt with Lurena Joiner in 1 mth for BP recheck.  Karle Plumber, MD, FACP

## 2020-07-14 LAB — COMPREHENSIVE METABOLIC PANEL
ALT: 80 IU/L — ABNORMAL HIGH (ref 0–44)
AST: 45 IU/L — ABNORMAL HIGH (ref 0–40)
Albumin/Globulin Ratio: 2 (ref 1.2–2.2)
Albumin: 5 g/dL (ref 4.0–5.0)
Alkaline Phosphatase: 96 IU/L (ref 44–121)
BUN/Creatinine Ratio: 14 (ref 9–20)
BUN: 11 mg/dL (ref 6–24)
Bilirubin Total: 0.6 mg/dL (ref 0.0–1.2)
CO2: 23 mmol/L (ref 20–29)
Calcium: 9.4 mg/dL (ref 8.7–10.2)
Chloride: 100 mmol/L (ref 96–106)
Creatinine, Ser: 0.8 mg/dL (ref 0.76–1.27)
GFR calc Af Amer: 124 mL/min/{1.73_m2} (ref >59)
GFR calc non Af Amer: 107 mL/min/{1.73_m2} (ref >59)
Globulin, Total: 2.5 g/dL (ref 1.5–4.5)
Glucose: 106 mg/dL — ABNORMAL HIGH (ref 65–99)
Potassium: 4.4 mmol/L (ref 3.5–5.2)
Sodium: 140 mmol/L (ref 134–144)
Total Protein: 7.5 g/dL (ref 6.0–8.5)

## 2020-07-14 LAB — LIPID PANEL
Chol/HDL Ratio: 6.1 ratio — ABNORMAL HIGH (ref 0.0–5.0)
Cholesterol, Total: 209 mg/dL — ABNORMAL HIGH (ref 100–199)
HDL: 34 mg/dL — ABNORMAL LOW (ref >39)
LDL Chol Calc (NIH): 74 mg/dL (ref 0–99)
Triglycerides: 641 mg/dL (ref 0–149)
VLDL Cholesterol Cal: 101 mg/dL — ABNORMAL HIGH (ref 5–40)

## 2020-07-14 LAB — CBC WITH DIFFERENTIAL/PLATELET
Basophils Absolute: 0.1 10*3/uL (ref 0.0–0.2)
Basos: 2 %
EOS (ABSOLUTE): 0.2 10*3/uL (ref 0.0–0.4)
Eos: 2 %
Hematocrit: 45.3 % (ref 37.5–51.0)
Hemoglobin: 15.2 g/dL (ref 13.0–17.7)
Immature Grans (Abs): 0 10*3/uL (ref 0.0–0.1)
Immature Granulocytes: 0 %
Lymphocytes Absolute: 2 10*3/uL (ref 0.7–3.1)
Lymphs: 32 %
MCH: 29 pg (ref 26.6–33.0)
MCHC: 33.6 g/dL (ref 31.5–35.7)
MCV: 87 fL (ref 79–97)
Monocytes Absolute: 0.5 10*3/uL (ref 0.1–0.9)
Monocytes: 7 %
Neutrophils Absolute: 3.6 10*3/uL (ref 1.4–7.0)
Neutrophils: 57 %
Platelets: 218 10*3/uL (ref 150–450)
RBC: 5.24 x10E6/uL (ref 4.14–5.80)
RDW: 13 % (ref 11.6–15.4)
WBC: 6.3 10*3/uL (ref 3.4–10.8)

## 2020-07-14 LAB — HIV ANTIBODY (ROUTINE TESTING W REFLEX): HIV Screen 4th Generation wRfx: NONREACTIVE

## 2020-07-14 LAB — HEMOGLOBIN A1C
Est. average glucose Bld gHb Est-mCnc: 148 mg/dL
Hgb A1c MFr Bld: 6.8 % — ABNORMAL HIGH (ref 4.8–5.6)

## 2020-07-14 LAB — HEPATITIS C ANTIBODY: Hep C Virus Ab: <0.1 s/co ratio (ref 0.0–0.9)

## 2020-07-14 NOTE — Addendum Note (Signed)
Addended by: Jonah Blue B on: 07/14/2020 05:13 PM   Modules accepted: Orders

## 2020-07-14 NOTE — Progress Notes (Signed)
Let patient know that his blood cell counts meaning his red blood cell, white blood cell and platelet counts are normal.  Kidney function normal.  He has diabetes.  Liver function tests of normal but stable compared to when last checked in July of last year.  Causes can include increased fat in the liver due to being overweight and having diabetes.  Healthy eating habits, regular exercise and weight loss will help control diabetes and also improve liver function.  Cutting back on alcohol consumption will also help.  I will asked the lab to also run blood test to screen for viral hepatitis a and B.  Screen for hepatitis C and HIV were negative.  Triglyceride level was 641 with goal being less than 150.  Again healthy eating habits and regular exercise will help to lower this level.  Please give him a follow-up appointment with me in 1 month for further discussion.

## 2020-07-16 ENCOUNTER — Telehealth: Payer: Self-pay | Admitting: General Practice

## 2020-07-16 NOTE — Telephone Encounter (Signed)
Wife states husband is at work.  However, she interprets for [place] [employer] english. Pt has had one pfizer vaccien.  Got sick with the first one. I encouraged her to have him schedule the 2nd vaccine so he would be fully vaccinated.  She has had nly one as well. Told her we could schedule them both and we went over vaccine sites. She is going to speak with pt and call back.Marland Kitchen

## 2020-07-18 ENCOUNTER — Telehealth: Payer: Self-pay

## 2020-07-18 LAB — HEPATITIS B SURFACE ANTIBODY, QUANTITATIVE: Hepatitis B Surf Ab Quant: <3.1 m[IU]/mL — ABNORMAL LOW (ref >9.9)

## 2020-07-18 LAB — SPECIMEN STATUS REPORT

## 2020-07-18 LAB — HEPATITIS B SURFACE ANTIGEN: Hepatitis B Surface Ag: NEGATIVE

## 2020-07-18 NOTE — Progress Notes (Signed)
He is not adequately immunized against hepatitis B virus.  If he would like to start the vaccine series, please give him an appointment as a nurse only visit to get the first shot of the hepatitis B vaccine.

## 2020-07-18 NOTE — Telephone Encounter (Signed)
[employer] Glenda Id#[id number] contacted pt to go over lab results pt is aware and doesn't have any questions or concerns  

## 2020-07-19 NOTE — Telephone Encounter (Signed)
Pt wife called back and would like to discuss his labs and dx with someone for a better understanding. Pt is there and can give the OK to speak with her.  She also wants to know if pt will be put on high cholesterol medication as well? What type of diabetes does he have? Should he be checking his sugars?   [phone]

## 2020-07-23 NOTE — Telephone Encounter (Signed)
FYI  [employer] Orma Flaming NM#[id number] contacted pt wife to go over lab results again and answer any questions she may have. Per pt wife should he be checking blood sugar and if so how many times... Made pt wife aware that pt has a  month f/u with provider on 4/1 and provider will go over cholesterol and dm with him. Pt wife states she understands and doesn't have any questions or concerns

## 2020-08-10 ENCOUNTER — Ambulatory Visit: Payer: Self-pay | Attending: Family Medicine | Admitting: Pharmacist

## 2020-08-10 ENCOUNTER — Encounter: Payer: Self-pay | Admitting: Pharmacist

## 2020-08-10 ENCOUNTER — Other Ambulatory Visit: Payer: Self-pay

## 2020-08-10 VITALS — BP 114/83

## 2020-08-10 DIAGNOSIS — R030 Elevated blood-pressure reading, without diagnosis of hypertension: Secondary | ICD-10-CM

## 2020-08-10 DIAGNOSIS — Z23 Encounter for immunization: Secondary | ICD-10-CM

## 2020-08-10 NOTE — Progress Notes (Signed)
   S:    PCP: Dr. Laural Benes Patient arrives in good spirits. Presents to the clinic for hypertension evaluation, counseling, and management. Patient was referred and last seen by Primary Care Provider on 07/13/2020.  Medication adherence: pt does not current take antihypertensives.  Current BP Medications include:  None  Dietary habits include: compliant with salt restriction; denies caffeine intake - he reports turning around his diet since being dx with DM at last PCP visit Exercise habits include: works out 30 minutes-1 hour several days a week  Family / Social history:  - FHx: HTN, DM - Tobacco: light tobacco smoker  - Alcohol: endorses occasional use   O:  Vitals:   08/10/20 1026  BP: 114/83   Home BP readings: none  Last 3 Office BP readings: BP Readings from Last 3 Encounters:  08/10/20 114/83  07/13/20 138/78  12/12/19 (!) 128/91    BMET    Component Value Date/Time   NA 140 07/13/2020 1207   K 4.4 07/13/2020 1207   CL 100 07/13/2020 1207   CO2 23 07/13/2020 1207   GLUCOSE 106 (H) 07/13/2020 1207   GLUCOSE 120 (H) 12/06/2019 2241   BUN 11 07/13/2020 1207   CREATININE 0.80 07/13/2020 1207   CREATININE 0.77 07/07/2013 0921   CALCIUM 9.4 07/13/2020 1207   GFRNONAA 107 07/13/2020 1207   GFRNONAA >89 07/07/2013 0921   GFRAA 124 07/13/2020 1207   GFRAA >89 07/07/2013 0921    Renal function: CrCl cannot be calculated (Patient's most recent lab result is older than the maximum 21 days allowed.).  Clinical ASCVD: No  The 10-year ASCVD risk score Denman  DC Jr., et al., 2013) is: 8.1%   Values used to calculate the score:     Age: 45 years     Sex: Male     Is Non-Hispanic African American: No     Diabetic: No     Tobacco smoker: Yes     Systolic Blood Pressure: 114 mmHg     Is BP treated: No     HDL Cholesterol: 34 mg/dL     Total Cholesterol: 209 mg/dL   A/P: Hypertension undiagnosed. BP today is better secondary to increased exercise and dietary  modification. No need to initiate medications at this current time. I did emphasize to him that we will monitor this and BP tx may be indicated in the future. -No medication changes today.    -Counseled on lifestyle modifications for blood pressure control including reduced dietary sodium, increased exercise, adequate sleep.  Results reviewed and written information provided.   Total time in face-to-face counseling 30 minutes.   F/U Clinic Visit in 1 month with PCP.   Butch Penny, PharmD, P[place]y [place], CPP Clinical Pharmacist [facility] [phone]

## 2020-08-31 ENCOUNTER — Ambulatory Visit: Payer: Self-pay | Attending: Internal Medicine | Admitting: Internal Medicine

## 2020-08-31 ENCOUNTER — Other Ambulatory Visit: Payer: Self-pay

## 2020-08-31 DIAGNOSIS — F1020 Alcohol dependence, uncomplicated: Secondary | ICD-10-CM | POA: Insufficient documentation

## 2020-08-31 DIAGNOSIS — E119 Type 2 diabetes mellitus without complications: Secondary | ICD-10-CM | POA: Insufficient documentation

## 2020-08-31 DIAGNOSIS — R7989 Other specified abnormal findings of blood chemistry: Secondary | ICD-10-CM

## 2020-08-31 DIAGNOSIS — R945 Abnormal results of liver function studies: Secondary | ICD-10-CM

## 2020-08-31 DIAGNOSIS — K625 Hemorrhage of anus and rectum: Secondary | ICD-10-CM | POA: Insufficient documentation

## 2020-08-31 MED ORDER — METFORMIN HCL 500 MG PO TABS: 250.0000 mg | ORAL_TABLET | Freq: Every day | ORAL | 3 refills | Status: DC

## 2020-08-31 NOTE — Progress Notes (Signed)
Virtual Visit via Telephone Note  I connected with Fred Mullins on 08/31/20 at  2:30 PM EDT by telephone and verified that I am speaking with the correct person using two identifiers.  Location: Patient: work Provider: office  Participants: Myself Patient [employer] interpreter: [phone]   I discussed the limitations, risks, security and privacy concerns of performing an evaluation and management service by telephone and the availability of in person appointments. I also discussed with the patient that there may be a patient responsible charge related to this service. The patient expressed understanding and agreed to proceed.   History of Present Illness: Patient with history of hemorrhoids, vitamin D deficiency, hyperTG.  Last seen 07/2020.  This is 1 mth f/u rectal bleeding and to discuss lab results . Complains of intermittent rectal bleeding x1 year on last visit.  Associated with rectal pain and itching.  He declined rectal exam.  Symptoms presumed to be due to hemorrhoids.  Counseling given and Anusol suppository prescribed.  Anusol has been helpful.  Reports no further rectal bleeding.  CBC normal.  I had told him to apply for the orange card/cone discount so that we can refer to GI for colon cancer screening via colonoscopy.  He has not done so as yet.    Abnormal LFTs: Found to have persistent elevation in AST and ALT similar to July of last year.  Screen for hepatitis C and B were negative. -Patient admits to heavy binge drinking.  Drinks about every 4 to 8 days and when he does, it is 15-20 beers at a time.  Since receiving his lab results, he has tried to cut back.  New DM: A1c was 6.8.  Triglyceride levels a little over 600. Since receiving these lab results, he states that he has tried to make changes in his eating habits.  He is exercising more.  Outpatient Encounter Medications as of 08/31/2020  Medication Sig  . hydrocortisone (ANUSOL-HC) 25 MG suppository Place 1  suppository (25 mg total) rectally 2 (two) times daily as needed for hemorrhoids or anal itching.   No facility-administered encounter medications on file as of 08/31/2020.    Observations/Objective: Results for orders placed or performed in visit on 07/13/20  CBC with Differential/Platelet  Result Value Ref Range   WBC 6.3 3.4 - 10.8 x10E3/uL   RBC 5.24 4.14 - 5.80 x10E6/uL   Hemoglobin 15.2 13.0 - 17.7 g/dL   Hematocrit 56.3 89.3 - 51.0 %   MCV 87 79 - 97 fL   MCH 29.0 26.6 - 33.0 pg   MCHC 33.6 31.5 - 35.7 g/dL   RDW 73.4 28.7 - 68.1 %   Platelets 218 150 - 450 x10E3/uL   Neutrophils 57 Not Estab. %   Lymphs 32 Not Estab. %   Monocytes 7 Not Estab. %   Eos 2 Not Estab. %   Basos 2 Not Estab. %   Neutrophils Absolute 3.6 1.4 - 7.0 x10E3/uL   Lymphocytes Absolute 2.0 0.7 - 3.1 x10E3/uL   Monocytes Absolute 0.5 0.1 - 0.9 x10E3/uL   EOS (ABSOLUTE) 0.2 0.0 - 0.4 x10E3/uL   Basophils Absolute 0.1 0.0 - 0.2 x10E3/uL   Immature Granulocytes 0 Not Estab. %   Immature Grans (Abs) 0.0 0.0 - 0.1 x10E3/uL  Comprehensive metabolic panel  Result Value Ref Range   Glucose 106 (H) 65 - 99 mg/dL   BUN 11 6 - 24 mg/dL   Creatinine, Ser 1.57 0.76 - 1.27 mg/dL   GFR calc non Af Denyse Dago  107 >59 mL/min/1.73   GFR calc Af Amer 124 >59 mL/min/1.73   BUN/Creatinine Ratio 14 9 - 20   Sodium 140 134 - 144 mmol/L   Potassium 4.4 3.5 - 5.2 mmol/L   Chloride 100 96 - 106 mmol/L   CO2 23 20 - 29 mmol/L   Calcium 9.4 8.7 - 10.2 mg/dL   Total Protein 7.5 6.0 - 8.5 g/dL   Albumin 5.0 4.0 - 5.0 g/dL   Globulin, Total 2.5 1.5 - 4.5 g/dL   Albumin/Globulin Ratio 2.0 1.2 - 2.2   Bilirubin Total 0.6 0.0 - 1.2 mg/dL   Alkaline Phosphatase 96 44 - 121 IU/L   AST 45 (H) 0 - 40 IU/L   ALT 80 (H) 0 - 44 IU/L  Hemoglobin A1c  Result Value Ref Range   Hgb A1c MFr Bld 6.8 (H) 4.8 - 5.6 %   Est. average glucose Bld gHb Est-mCnc 148 mg/dL  Lipid panel  Result Value Ref Range   Cholesterol, Total 209 (H) 100 -  199 mg/dL   Triglycerides 932 (HH) 0 - 149 mg/dL   HDL 34 (L) >67 mg/dL   VLDL Cholesterol Cal 101 (H) 5 - 40 mg/dL   LDL Chol Calc (NIH) 74 0 - 99 mg/dL   Chol/HDL Ratio 6.1 (H) 0.0 - 5.0 ratio  HIV Antibody (routine testing w rflx)  Result Value Ref Range   HIV Screen 4th Generation wRfx Non Reactive Non Reactive  Hepatitis C Antibody  Result Value Ref Range   Hep C Virus Ab <0.1 0.0 - 0.9 s/co ratio  Hepatitis B surface antibody,quantitative  Result Value Ref Range   Hepatitis B Surf Ab Quant <3.1 (L) Immunity>9.9 mIU/mL  Hepatitis B surface antigen  Result Value Ref Range   Hepatitis B Surface Ag Negative Negative  Specimen status report  Result Value Ref Range   specimen status report Comment    Depression screen [facility] 2/9 07/13/2020 01/28/2018 10/02/2016  Decreased Interest 0 1 0  Down, Depressed, Hopeless 0 1 0  PHQ - 2 Score 0 2 0  Altered sleeping - 3 -  Tired, decreased energy - 3 -  Change in appetite - 2 -  Feeling bad or failure about yourself  - 1 -  Trouble concentrating - 0 -  Moving slowly or fidgety/restless - 0 -  Suicidal thoughts - 0 -  PHQ-9 Score - 11 -     Assessment and Plan: 1. New onset type 2 diabetes mellitus (HCC) Discussed the importance of healthy eating habits, regular aerobic exercise (at least 150 minutes a week as tolerated) and medication compliance to achieve or maintain control of diabetes. Dietary counseling given: Cut back on alcohol consumption, eliminate sugary drinks from the diet and drink more water, cut back on portion sizes of white carbohydrates, try to eat more lean white meat instead of red meat or pork, incorporate fresh fruits and vegetables into the diet -We will get him in with the clinical pharmacist for further diabetic teaching -Start Metformin 500 mg half tablet daily.  2. Alcohol use disorder, moderate, dependence (HCC) Strongly advised him to cut back or stop altogether.  What he is doing is binge drinking.  Discussed  health risks associated with excessive alcohol use including damage to the liver.  Advised no more than 2 small cans of beer per day.  He states he will try to cut back if it will improve his health.  3. Rectal bleeding Likely due to hemorrhoids.  Resolved  with Anusol.  Still recommend colon cancer screening.  Went over with him again how to apply for the orange card/cone discount  4. Abnormal LFTs Likely due to fatty liver and excessive alcohol.  Stressed the importance of weight loss and cutting back on the amount that he drinks.   Follow Up Instructions: 3 mths Give appointment with clinical pharmacist in 3 weeks for diabetic teaching   I discussed the assessment and treatment plan with the patient. The patient was provided an opportunity to ask questions and all were answered. The patient agreed with the plan and demonstrated an understanding of the instructions.   The patient was advised to call back or seek an in-person evaluation if the symptoms worsen or if the condition fails to improve as anticipated.  I spent 22 minutes on this telephone encounter Jonah Blue, MD

## 2020-09-04 ENCOUNTER — Other Ambulatory Visit: Payer: Self-pay

## 2020-09-04 MED ORDER — METFORMIN HCL 500 MG PO TABS
250.0000 mg | ORAL_TABLET | Freq: Every day | ORAL | 3 refills | Status: DC
  Filled 2020-09-04: qty 15, 30d supply, fill #0

## 2020-09-12 ENCOUNTER — Other Ambulatory Visit: Payer: Self-pay

## 2020-09-20 ENCOUNTER — Encounter: Payer: Self-pay | Admitting: Pharmacist

## 2020-09-20 ENCOUNTER — Ambulatory Visit: Payer: Self-pay | Attending: Internal Medicine | Admitting: Pharmacist

## 2020-09-20 ENCOUNTER — Other Ambulatory Visit: Payer: Self-pay

## 2020-09-20 VITALS — BP 129/85

## 2020-09-20 DIAGNOSIS — E119 Type 2 diabetes mellitus without complications: Secondary | ICD-10-CM

## 2020-09-20 LAB — GLUCOSE, POCT (MANUAL RESULT ENTRY): POC Glucose: 124 mg/dl — AB (ref 70–99)

## 2020-09-20 MED ORDER — METFORMIN HCL ER 500 MG PO TB24
500.0000 mg | ORAL_TABLET | Freq: Every day | ORAL | 2 refills | Status: DC
  Filled 2020-09-20: qty 30, 30d supply, fill #0

## 2020-09-20 MED ORDER — TRUE METRIX METER W/DEVICE KIT
PACK | 0 refills | Status: AC
  Filled 2020-09-20: qty 1, 365d supply, fill #0

## 2020-09-20 MED ORDER — TRUEPLUS LANCETS 28G MISC
2 refills | Status: AC
  Filled 2020-09-20: qty 100, 100d supply, fill #0

## 2020-09-20 MED ORDER — TRUE METRIX BLOOD GLUCOSE TEST VI STRP
ORAL_STRIP | 2 refills | Status: DC
  Filled 2020-09-20: qty 100, 100d supply, fill #0

## 2020-09-20 NOTE — Progress Notes (Signed)
     S:    PCP: Dr. Laural Benes   No chief complaint on file.  Patient arrives in good spirits. Presents for diabetes evaluation, education, and management Patient was referred and last seen by Primary Care Provider on 08/31/2020.   Patient reports Diabetes is newly dx. Has a hx of elevated transaminases likely related to alcohol consumption/fatty liver. He was started on metformin 250mg  daily but today he tells me he never picked this up.   Family/Social History:  - Fhx: HLD, DM - Tobacco: light tobacco smoker - Alcohol: endorses decreased intake - drinks now only 5-6 beers on the weekend (down from 15-20 q3-4 days)  Insurance coverage/medication affordability: self pay  Medication adherence denied. Has not picked up the metformin.   Current diabetes medications include: metformin 250 mg daily Current hypertension medications include: none  Current hyperlipidemia medications include: none   Patient denies hypoglycemic events.  Patient reported dietary habits: - Has decreased portion sizes but not fully knowledgeable regarding a diabetic diet  - Has decreased beer consumption to 5-6  Patient-reported exercise habits:  - None    Patient denies nocturia (nighttime urination).  Patient denies neuropathy (nerve pain). Patient denies visual changes. Patient reports self foot exams.    O: POCT glucose: 124   Lab Results  Component Value Date   HGBA1C 6.8 (H) 07/13/2020   Vitals:   09/20/20 1019  BP: 129/85   Lipid Panel     Component Value Date/Time   CHOL 209 (H) 07/13/2020 1207   TRIG 641 (HH) 07/13/2020 1207   HDL 34 (L) 07/13/2020 1207   CHOLHDL 6.1 (H) 07/13/2020 1207   CHOLHDL 4.5 06/19/2014 1223   VLDL 56 (H) 06/19/2014 1223   LDLCALC 74 07/13/2020 1207   Home fasting blood sugars: not checking   2 hour post-meal/random blood sugars: not checking.  Clinical Atherosclerotic Cardiovascular Disease (ASCVD): No  The 10-year ASCVD risk score 1208 DC Jr., et  al., 2013) is: 18.3%   Values used to calculate the score:     Age: 45 years     Sex: Male     Is Non-Hispanic African American: No     Diabetic: Yes     Tobacco smoker: Yes     Systolic Blood Pressure: 129 mmHg     Is BP treated: No     HDL Cholesterol: 34 mg/dL     Total Cholesterol: 209 mg/dL   A/P: Diabetes newly dx currently at goal. Patient is able to verbalize appropriate hypoglycemia management plan. He never started the metformin. I recommend to change to XR. -Stop metformin regular release  -Start metformin 500 mg XR daily.  -True Metrix supplies sent; Patient was educated on the use of the meter. Reviewed necessary supplies and operation of the meter. Patient was able to demonstrate use. All questions and concerns were addressed. -Extensively discussed pathophysiology of diabetes, recommended lifestyle interventions, dietary effects on blood sugar control -Counseled on s/sx of and management of hypoglycemia -Next A1C anticipated 09/2020.   Written patient instructions provided. Total time in face to face counseling 30 minutes.   Follow up Pharmacist Clinic Visit in 2-3 weeks.    10/2020, PharmD, Butch Penny, CPP Clinical Pharmacist [facility] [phone]

## 2020-09-27 ENCOUNTER — Other Ambulatory Visit: Payer: Self-pay

## 2020-10-11 ENCOUNTER — Ambulatory Visit: Payer: Self-pay | Admitting: Pharmacist

## 2020-10-18 ENCOUNTER — Ambulatory Visit: Payer: Self-pay | Admitting: Pharmacist

## 2020-11-30 ENCOUNTER — Ambulatory Visit: Payer: Self-pay | Admitting: Internal Medicine

## 2020-12-27 ENCOUNTER — Telehealth: Payer: Self-pay | Admitting: Internal Medicine

## 2020-12-27 NOTE — Telephone Encounter (Signed)
Copied from CRM [id number]. Topic: Quick Communication - Rx Refill/Question >> Dec 27, 2020  3:01 PM Jaquita Rector A wrote: Medication: fluticasone (FLONASE) 50 MCG/ACT nasal spray  Has the patient contacted their pharmacy? Yes.   (Agent: If no, request that the patient contact the pharmacy for the refill.) (Agent: If yes, when and what did the pharmacy advise?)  Preferred Pharmacy (with phone number or street name): [facility]  Phone:  [phone] Fax:  [phone]     Agent: Please be advised that RX refills may take up to 3 business days. We ask that you follow-up with your pharmacy.

## 2021-10-21 ENCOUNTER — Other Ambulatory Visit: Payer: Self-pay

## 2021-11-08 ENCOUNTER — Ambulatory Visit: Payer: MEDICAID | Attending: Internal Medicine | Admitting: Internal Medicine

## 2021-11-08 ENCOUNTER — Encounter: Payer: Self-pay | Admitting: Internal Medicine

## 2021-11-08 ENCOUNTER — Other Ambulatory Visit: Payer: Self-pay

## 2021-11-08 VITALS — BP 108/70 | HR 58 | Wt 211.6 lb

## 2021-11-08 DIAGNOSIS — R7989 Other specified abnormal findings of blood chemistry: Secondary | ICD-10-CM

## 2021-11-08 DIAGNOSIS — F1091 Alcohol use, unspecified, in remission: Secondary | ICD-10-CM

## 2021-11-08 DIAGNOSIS — E119 Type 2 diabetes mellitus without complications: Secondary | ICD-10-CM

## 2021-11-08 DIAGNOSIS — Z1211 Encounter for screening for malignant neoplasm of colon: Secondary | ICD-10-CM

## 2021-11-08 DIAGNOSIS — E1169 Type 2 diabetes mellitus with other specified complication: Secondary | ICD-10-CM

## 2021-11-08 DIAGNOSIS — E669 Obesity, unspecified: Secondary | ICD-10-CM

## 2021-11-08 DIAGNOSIS — E781 Pure hyperglyceridemia: Secondary | ICD-10-CM

## 2021-11-08 LAB — POCT GLYCOSYLATED HEMOGLOBIN (HGB A1C): HbA1c, POC (controlled diabetic range): 6.2 % (ref 0.0–7.0)

## 2021-11-08 LAB — GLUCOSE, POCT (MANUAL RESULT ENTRY): POC Glucose: 120 mg/dl — AB (ref 70–99)

## 2021-11-08 MED ORDER — METFORMIN HCL ER 500 MG PO TB24
500.0000 mg | ORAL_TABLET | Freq: Every day | ORAL | 1 refills | Status: DC
  Filled 2021-11-08: qty 90, 90d supply, fill #0

## 2021-11-08 NOTE — Progress Notes (Signed)
Patient ID: Fred Mullins, male    DOB: 10-17-1975  MRN: [id number]  CC: Diabetes   Subjective: Fred Mullins is a 46 y.o. male who presents for chronic ds management His concerns today include:  Patient with history of EtOH use disorder, abnormal LFTs, hyperTG, DM type 2   DM: Results for orders placed or performed in visit on 11/08/21  POCT glucose (manual entry)  Result Value Ref Range   POC Glucose 120 (A) 70 - 99 mg/dl  POCT glycosylated hemoglobin (Hb A1C)  Result Value Ref Range   Hemoglobin A1C     HbA1c POC (<> result, manual entry)     HbA1c, POC (prediabetic range)     HbA1c, POC (controlled diabetic range) 6.2 0.0 - 7.0 %  Dx with DM in spring of last yr.  Started on Metformin and saw clinical pharmacist fpr DM teaching.  Took Metformin for 1 mth consistently.  Since then he has been taking it about 2-3 times a week.   Does not check BS.  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 Glaucoma suspect but no DM retinopathy.  No blurred vision at this time Pt with hx of mild elev of LFT mainly AST/ALT.  Last values 45/80.  Screen for hep B and C neg TG was also elev at 641 with LDL 74.   Patient Active Problem List   Diagnosis Date Noted   Alcohol use disorder, moderate, dependence ([place]) 08/31/2020   New onset type 2 diabetes mellitus ([place]) 08/31/2020   Abnormal LFTs 08/31/2020   Rectal bleeding 08/31/2020   Influenza vaccine needed 07/13/2020   Obesity (BMI 30.0-34.9) 07/13/2020   Elevated blood pressure reading without diagnosis of hypertension 07/13/2020   Family history of diabetes mellitus 11/04/2013   Hypertriglyceridemia 11/04/2013   Unspecified vitamin D deficiency 11/04/2013   Hemorrhoid 11/04/2013   Unspecified constipation 11/04/2013   Dental caries 12/16/2012   Hypersomnia with sleep apnea, unspecified 12/15/2012   Allergic rhinitis  12/15/2012   Chronic headaches 12/15/2012     Current Outpatient Medications on File Prior to Visit  Medication Sig Dispense Refill   Blood Glucose Monitoring Suppl (TRUE METRIX METER) w/Device KIT Use as instructed to check blood sugar once daily. 1 kit 0   glucose blood (TRUE METRIX BLOOD GLUCOSE TEST) test strip Use as instructed to check blood sugar once daily. 100 each 2   hydrocortisone (ANUSOL-HC) 25 MG suppository Place 1 suppository (25 mg total) rectally 2 (two) times daily as needed for hemorrhoids or anal itching. 12 suppository 1   TRUEplus Lancets 28G MISC Use to instructed to check blood sugar once daily. 100 each 2   No current facility-administered medications on file prior to visit.    Allergies  Allergen Reactions   Pollen Extract Other (See Comments)    Unknown    Social History   Socioeconomic History   Marital status: Single    Spouse name: Not on file   Number of children: 4   Years of education: Not on file   Highest education level: Not on file  Occupational History   Not on file  Tobacco Use   Smoking status: Light Smoker    Types: Cigarettes   Smokeless tobacco: Never  Vaping Use   Vaping Use: Never used  Substance and Sexual Activity   Alcohol use: Yes    Comment: occasonally  Drug use: No   Sexual activity: Not on file  Other Topics Concern   Not on file  Social History Narrative   Not on file   Social Determinants of Health   Financial Resource Strain: Not on file  Food Insecurity: Not on file  Transportation Needs: Not on file  Physical Activity: Not on file  Stress: Not on file  Social Connections: Not on file  Intimate Partner Violence: Not on file    Family History  Problem Relation Age of Onset   Hyperlipidemia Mother    Diabetes Father     Past Surgical History:  Procedure Laterality Date   FOOT SURGERY     cyst removed from foot as a child   ORIF ANKLE FRACTURE Left 10/26/2015   Procedure: OPEN REDUCTION INTERNAL  FIXATION (ORIF) LEFT FIBULA FRACTURE;  Surgeon: Newt Minion, MD;  Location: [facility];  Service: Orthopedics;  Laterality: Left;   ORIF WRIST FRACTURE Left 12/12/2019   Procedure: OPEN TREATMENT OF LEFT FOREARM FRACTURE;  Surgeon: Milly Jakob, MD;  Location: [facility];  Service: Orthopedics;  Laterality: Left;  REGIONAL/MAC    ROS: Review of Systems Negative except as stated above  PHYSICAL EXAM: BP 108/70   Pulse (!) 58   Wt 211 lb 9.6 oz (96 kg)   SpO2 98%   BMI 33.14 kg/m   Wt Readings from Last 3 Encounters:  11/08/21 211 lb 9.6 oz (96 kg)  07/13/20 217 lb 6.4 oz (98.6 kg)  12/12/19 211 lb 10.3 oz (96 kg)    Physical Exam   General appearance - alert, well appearing, and in no distress Mental status - normal mood, behavior, speech, dress, motor activity, and thought processes Neck - supple, no significant adenopathy Chest - clear to auscultation, no wheezes, rales or rhonchi, symmetric air entry Heart - normal rate, regular rhythm, normal S1, S2, no murmurs, rubs, clicks or gallops Extremities - peripheral pulses normal, no pedal edema, no clubbing or cyanosis Diabetic Foot Exam - Simple   Simple Foot Form Diabetic Foot exam was performed with the following findings: Yes 11/08/2021 12:08 PM  Visual Inspection See comments: Yes Sensation Testing Intact to touch and monofilament testing bilaterally: Yes Pulse Check Posterior Tibialis and Dorsalis pulse intact bilaterally: Yes Comments Noninflamed bunions on both big toes.        Latest Ref Rng & Units 07/13/2020   12:07 PM 12/06/2019   10:41 PM 06/30/2016    6:29 PM  CMP  Glucose 65 - 99 mg/dL 106  120  92   BUN 6 - 24 mg/dL _0 Creatinine 0.76 - 1.27 mg/dL 0.80  0.85  0.66   Sodium 134 - 144 mmol/L 140  139  137   Potassium 3.5 - 5.2 mmol/L 4.4  3.9  4.4   Chloride 96 - 106 mmol/L 100  101  104   CO2 20 - 29 mmol/L _1 Calcium 8.7 - 10.2 mg/dL 9.4  9.2  9.3   Total Protein 6.0  - 8.5 g/dL 7.5  7.5    Total Bilirubin 0.0 - 1.2 mg/dL 0.6  0.8    Alkaline Phos 44 - 121 IU/L 96  78    AST 0 - 40 IU/L 45  45    ALT 0 - 44 IU/L 80  71     Lipid Panel     Component Value Date/Time   CHOL 209 (H) 07/13/2020  1207   TRIG 641 (HH) 07/13/2020 1207   HDL 34 (L) 07/13/2020 1207   CHOLHDL 6.1 (H) 07/13/2020 1207   CHOLHDL 4.5 06/19/2014 1223   VLDL 56 (H) 06/19/2014 1223   LDLCALC 74 07/13/2020 1207    CBC    Component Value Date/Time   WBC 6.3 07/13/2020 1207   WBC 6.8 12/06/2019 2241   RBC 5.24 07/13/2020 1207   RBC 4.93 12/06/2019 2241   HGB 15.2 07/13/2020 1207   HCT 45.3 07/13/2020 1207   PLT 218 07/13/2020 1207   MCV 87 07/13/2020 1207   MCH 29.0 07/13/2020 1207   MCH 29.4 12/06/2019 2241   MCHC 33.6 07/13/2020 1207   MCHC 33.7 12/06/2019 2241   RDW 13.0 07/13/2020 1207   LYMPHSABS 2.0 07/13/2020 1207   MONOABS 0.7 06/30/2016 1829   EOSABS 0.2 07/13/2020 1207   BASOSABS 0.1 07/13/2020 1207    ASSESSMENT AND PLAN: 1. Diabetes mellitus type 2 in obese [facility]) Patient advised to eliminate sugary drinks from the diet, cut back on portion sizes especially of white carbohydrates, eat more white lean meat like chicken [place] and seafood instead of beef or pork and incorporate fresh fruits and vegetables into the diet daily. Encouraged him to stay active. A1c looks good considering he has not been taking the metformin consistently.  However patient would like to stay on the metformin.  I have sent a refill to the pharmacy. Overdue for diabetic eye exam.  Encouraged him to get one done when he can afford to do so financially. - POCT glucose (manual entry) - POCT glycosylated hemoglobin (Hb A1C) - CBC - Comprehensive metabolic panel - Lipid panel - Microalbumin / creatinine urine ratio - metFORMIN (GLUCOPHAGE-XR) 500 MG 24 hr tablet; Take 1 tablet (500 mg total) by mouth daily with breakfast.  Dispense: 90 tablet; Refill: 1  2. Abnormal LFTs Advised that  abnormal LFTs was likely due to binge drinking which she was doing at that time plus or minus component of fatty liver.  Discussed the importance of healthy eating habits and weight loss to address fatty liver disease.  3. Hypertriglyceridemia Recheck lipid profile today.  4. Screening for colon cancer Discussed colon cancer screening.  Patient agreeable to doing FIT tests. - Fecal occult blood, imunochemical([facility]) 5.  Alcohol use disorder in remission Commended him on quitting.  Encouraged to remain free of alcohol.  [employer] Language interpreter used during this encounter. #[id number] [phone]  Patient was given the opportunity to ask questions.  Patient verbalized understanding of the plan and was able to repeat key elements of the plan.   This documentation was completed using Radio producer.  Any transcriptional errors are unintentional.  Orders Placed This Encounter  Procedures   Fecal occult blood, imunochemical([facility]/Sun[facility])   CBC   Comprehensive metabolic panel   Lipid panel   Microalbumin / creatinine urine ratio   POCT glucose (manual entry)   POCT glycosylated hemoglobin (Hb A1C)     Requested Prescriptions   Signed Prescriptions Disp Refills   metFORMIN (GLUCOPHAGE-XR) 500 MG 24 hr tablet 90 tablet 1    Sig: Take 1 tablet (500 mg total) by mouth daily with breakfast.    Return in about 6 months (around 05/10/2022).  Karle Plumber, MD, FACP

## 2021-11-09 NOTE — Progress Notes (Signed)
Let pt know that kidney and liver function tests are normal. Blood cell counts normal. Cholesterol level significant ly improved. Healthy eating habits and regular exercise as discussed on recent visit will help to lower cholesterol levels even more.

## 2021-11-10 LAB — CBC
Hematocrit: 43.6 % (ref 37.5–51.0)
Hemoglobin: 14.5 g/dL (ref 13.0–17.7)
MCH: 28.7 pg (ref 26.6–33.0)
MCHC: 33.3 g/dL (ref 31.5–35.7)
MCV: 86 fL (ref 79–97)
Platelets: 197 10*3/uL (ref 150–450)
RBC: 5.06 x10E6/uL (ref 4.14–5.80)
RDW: 13.2 % (ref 11.6–15.4)
WBC: 5.8 10*3/uL (ref 3.4–10.8)

## 2021-11-10 LAB — COMPREHENSIVE METABOLIC PANEL
ALT: 38 IU/L (ref 0–44)
AST: 28 IU/L (ref 0–40)
Albumin/Globulin Ratio: 2 (ref 1.2–2.2)
Albumin: 4.8 g/dL (ref 4.0–5.0)
Alkaline Phosphatase: 87 IU/L (ref 44–121)
BUN/Creatinine Ratio: 19 (ref 9–20)
BUN: 14 mg/dL (ref 6–24)
Bilirubin Total: 0.7 mg/dL (ref 0.0–1.2)
CO2: 24 mmol/L (ref 20–29)
Calcium: 8.9 mg/dL (ref 8.7–10.2)
Chloride: 105 mmol/L (ref 96–106)
Creatinine, Ser: 0.72 mg/dL — ABNORMAL LOW (ref 0.76–1.27)
Globulin, Total: 2.4 g/dL (ref 1.5–4.5)
Glucose: 97 mg/dL (ref 70–99)
Potassium: 4.4 mmol/L (ref 3.5–5.2)
Sodium: 144 mmol/L (ref 134–144)
Total Protein: 7.2 g/dL (ref 6.0–8.5)
eGFR: 113 mL/min/{1.73_m2} (ref >59)

## 2021-11-10 LAB — MICROALBUMIN / CREATININE URINE RATIO
Creatinine, Urine: 132.3 mg/dL
Microalb/Creat Ratio: 9 mg/g creat (ref 0–29)
Microalbumin, Urine: 12.5 ug/mL

## 2021-11-10 LAB — LIPID PANEL
Chol/HDL Ratio: 4.9 ratio (ref 0.0–5.0)
Cholesterol, Total: 161 mg/dL (ref 100–199)
HDL: 33 mg/dL — ABNORMAL LOW (ref >39)
LDL Chol Calc (NIH): 92 mg/dL (ref 0–99)
Triglycerides: 209 mg/dL — ABNORMAL HIGH (ref 0–149)
VLDL Cholesterol Cal: 36 mg/dL (ref 5–40)

## 2022-01-06 ENCOUNTER — Ambulatory Visit: Payer: Self-pay

## 2022-01-06 NOTE — Telephone Encounter (Signed)
  Chief Complaint: Ear pain and fullness Symptoms: decreased hearing, ear fullness, white discharge and pain. Frequency: 1 month Pertinent Negatives: Patient denies fever, stiff neck Disposition: [] ED /[x] Urgent Care (no appt availability in office) / [] Appointment(In office/virtual)/ []  [facility]/ [] Home Care/ [] Refused Recommended Disposition /[] [facility]/ []  Follow-up with PCP Additional Notes: Pt has had ear fullness, pain, discharge and decreased hearing for about 1 month. Pt had gone swimming about 2 months ago.     Summary: ear pain   PT's wife called saying her husband has pain in his ear, can't hear well and has white in his ear.  She is asking for an appt asap.  There are no appts available today.   [phone] or [phone]      Reason for Disposition  White, yellow, or green discharge  Answer Assessment - Initial Assessment Questions 1. LOCATION: "Which ear is involved?"     Right ear 2. ONSET: "When did the ear start hurting"      1 month 3. SEVERITY: "How bad is the pain?"  (Scale 1-10; mild, moderate or severe)   - MILD (1-3): doesn't interfere with normal activities    - MODERATE (4-7): interferes with normal activities or awakens from sleep    - SEVERE (8-10): excruciating pain, unable to do any normal activities      8/10 4. URI SYMPTOMS: "Do you have a runny nose or cough?"     no 5. FEVER: "Do you have a fever?" If Yes, ask: "What is your temperature, how was it measured, and when did it start?"     no 6. CAUSE: "Have you been swimming recently?", "How often do you use Q-TIPS?", "Have you had any recent air travel or scuba diving?"     No swimming, no 7. OTHER SYMPTOMS: "Do you have any other symptoms?" (e.g., headache, stiff neck, dizziness, vomiting, runny nose, decreased hearing)     Decreased hearing 8. PREGNANCY: "Is there any chance you are pregnant?" "When was your last menstrual period?"     no  Protocols used:  [place]-A-AH

## 2022-01-07 NOTE — Telephone Encounter (Signed)
Patient was seen in Urgent Care.
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
## 2022-04-21 IMAGING — RF DG C-ARM 1-60 MIN
1 series · 3 of 3 positions shown · non-contrast
Comparison: Left forearm radiographs 12/10/2019

CLINICAL DATA: ORIF.

EXAM:
LEFT WRIST - COMPLETE 3+ VIEW; DG C-ARM 1-60 MIN

[Series 1: run · 3 of 3 slices shown]
[im 1/3]
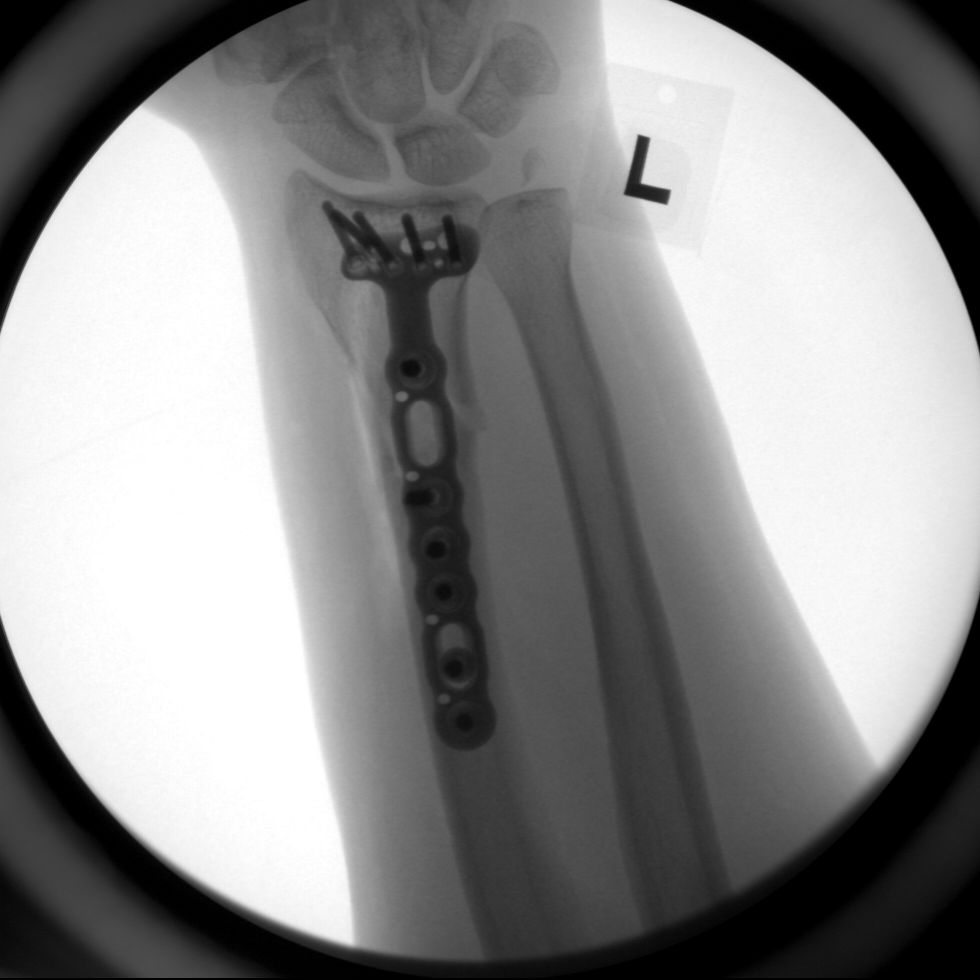
[im 2/3]
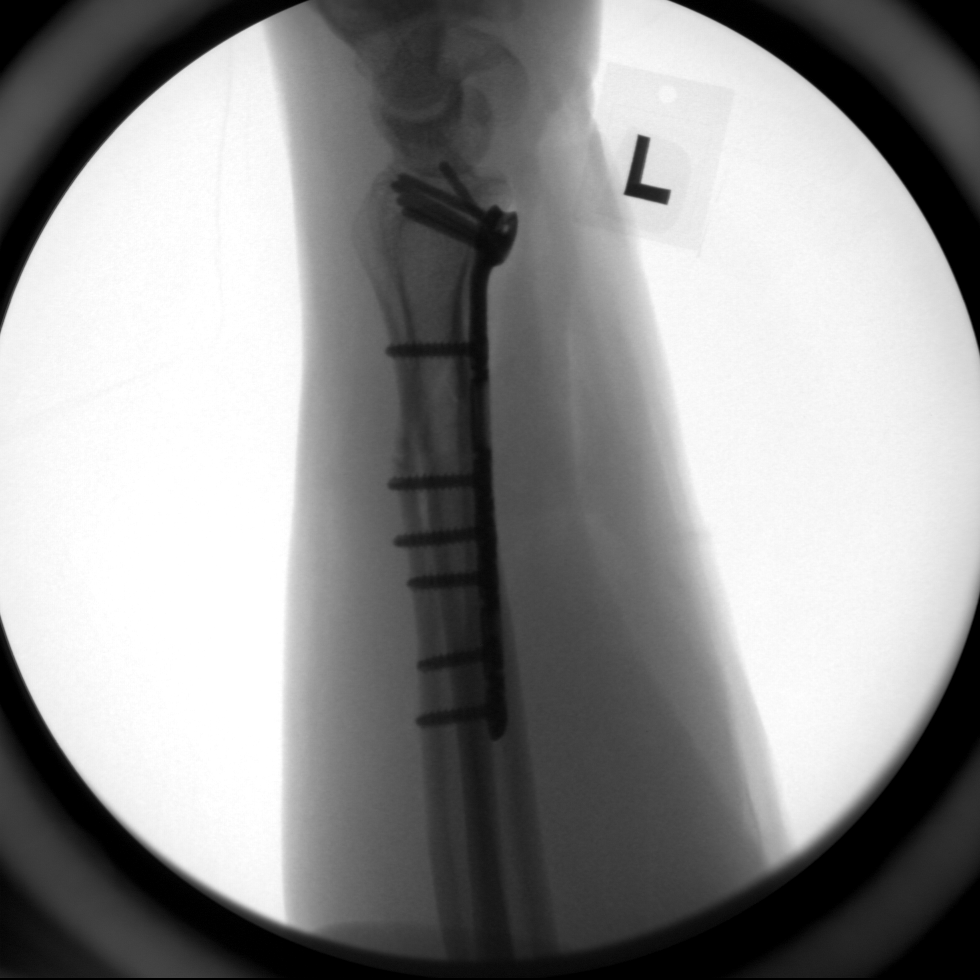
[im 3/3]
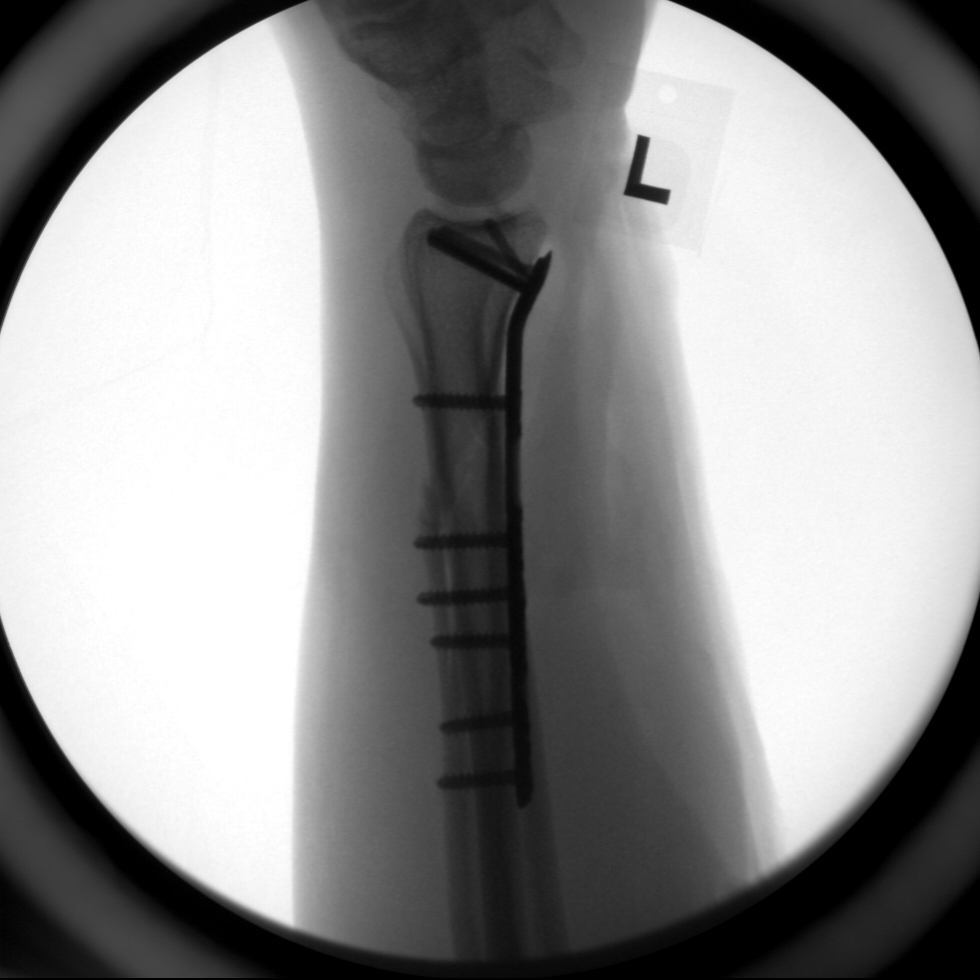

[3 of 3 positions shown; findings below may reference images not displayed]

FINDINGS: Ventral plate and screw fixation place. Comminuted distal radius
fracture has been reduced. Hardware is intact.

33.3 seconds fluoroscopy time is reported.
IMPRESSION: ORIF of the left wrist without radiographic evidence for
complication.

## 2022-05-12 ENCOUNTER — Other Ambulatory Visit: Payer: Self-pay

## 2022-05-12 ENCOUNTER — Ambulatory Visit: Payer: MEDICAID | Attending: Internal Medicine | Admitting: Internal Medicine

## 2022-05-12 VITALS — BP 121/78 | HR 54 | Ht 67.0 in | Wt 212.0 lb

## 2022-05-12 DIAGNOSIS — Z23 Encounter for immunization: Secondary | ICD-10-CM

## 2022-05-12 DIAGNOSIS — E669 Obesity, unspecified: Secondary | ICD-10-CM

## 2022-05-12 DIAGNOSIS — Z1211 Encounter for screening for malignant neoplasm of colon: Secondary | ICD-10-CM

## 2022-05-12 DIAGNOSIS — E1169 Type 2 diabetes mellitus with other specified complication: Secondary | ICD-10-CM

## 2022-05-12 DIAGNOSIS — E782 Mixed hyperlipidemia: Secondary | ICD-10-CM

## 2022-05-12 LAB — POCT GLYCOSYLATED HEMOGLOBIN (HGB A1C): HbA1c, POC (controlled diabetic range): 6.3 % (ref 0.0–7.0)

## 2022-05-12 LAB — GLUCOSE, POCT (MANUAL RESULT ENTRY): POC Glucose: 137 mg/dl — AB (ref 70–99)

## 2022-05-12 MED ORDER — ATORVASTATIN CALCIUM 10 MG PO TABS
10.0000 mg | ORAL_TABLET | Freq: Every day | ORAL | 4 refills | Status: DC
  Filled 2022-05-12: qty 30, 30d supply, fill #0

## 2022-05-12 NOTE — Patient Instructions (Signed)
Alimentacin saludable Healthy Eating Seguir una modalidad de alimentacin saludable puede ayudarlo a alcanzar y mantener un peso saludable, reducir el riesgo de tener enfermedades crnicas y vivir una vida larga y productiva. Es importante que siga una modalidad de alimentacin saludable con un nivel adecuado de caloras para su cuerpo. Debe cubrir sus necesidades nutricionales principalmente a travs de los alimentos, escogiendo una variedad de alimentos ricos en nutrientes. Cules son algunos consejos para seguir este plan? Lea las etiquetas de los alimentos Lea las etiquetas y elija las que digan lo siguiente: Reducido en sodio o con bajo contenido de sodio. Jugos con 100 % jugo de fruta. Alimentos con bajo contenido de grasas saturadas y alto contenido de grasas poliinsaturadas y monoinsaturadas. Alimentos con cereales integrales, como trigo integral, trigo partido, arroz integral y arroz salvaje. Cereales integrales fortificados con cido flico. Se recomienda a las mujeres embarazadas o que desean quedar embarazadas. Lea las etiquetas y evite: Los alimentos con una gran cantidad de azcares agregados. Estos incluyen los alimentos que contienen azcar moreno, endulzante a base de maz, jarabe de maz, dextrosa, fructosa, glucosa, jarabe de maz de alta fructosa, miel, azcar invertido, lactosa, jarabe de malta, maltosa, melaza, azcar sin refinar, sacarosa, trehalosa y azcar turbinado. No consuma ms que las siguientes cantidades de azcar agregada por da: 6 cucharaditas (25 g) las mujeres. 9 cucharaditas (38 g) los hombres. Los alimentos que contienen almidones y cereales refinados o procesados. Los productos de cereales refinados, como harina blanca, harina de maz desgerminada, pan blanco y arroz blanco. Al ir de compras Elija refrigerios ricos en nutrientes, como verduras, frutas enteras y frutos secos. Evite los refrigerios con alto contenido de caloras y azcar, como las papas  fritas, los refrigerios frutales y los caramelos. Use alios y productos para untar a base de aceite con los alimentos en lugar de grasas slidas como la mantequilla, la margarina en barra o el queso crema. Limite las salsas, las mezclas y los productos "instantneos" preelaborados como el arroz saborizado, los fideos instantneos y las pastas listas para comer. Pruebe ms fuentes de protena vegetal, como tofu, tempeh, frijoles negros, edamame, lentejas, frutos secos y semillas. Explore planes de alimentacin como la dieta mediterrnea o la dieta vegetariana. Al cocinar Use aceite para saltear los alimentos en lugar de grasas slidas como mantequilla, margarina en barra o manteca de cerdo. En lugar de frer, trate de cocinar en el horno, en la plancha o en la parrilla, o hervir los alimentos. Retire la parte grasa de las carnes antes de cocinarlas. Cocine las verduras al vapor en agua o caldo. Planificacin de las comidas  En las comidas, imagine dividir su plato en cuartos: La mitad del plato tiene frutas y verduras. Un cuarto del plato tiene cereales integrales. Un cuarto del plato tiene protena, especialmente carnes magras, aves, huevos, tofu, frijoles o frutos secos. Incluya lcteos descremados en su dieta diaria. Estilo de vida Elija opciones saludables en todos los mbitos, como en el hogar, el trabajo, la escuela, los restaurantes y las tiendas. Prepare los alimentos de un modo seguro: Lvese las manos despus de manipular carnes crudas. Mantenga las superficies de preparacin de los alimentos limpias lavndolas regularmente con agua caliente y jabn. Mantenga las carnes crudas separadas de los alimentos que estn listos para comer como las frutas y las verduras. Cocine los frutos de mar, carnes, aves y huevos hasta alcanzar la temperatura interna recomendada. Almacene los alimentos a temperaturas seguras. En general: Mantenga los alimentos fros a una temperatura de 40   F (4,4 C)  o inferior. Mantenga los alimentos calientes a una temperatura de 140 F (60 C) o superior. Mantenga el congelador a una temperatura de 0 F (-17,8 C) o inferior. Los alimentos dejan de ser seguros para su consumo cuando han estado a una temperatura de entre 40 y 140 F (4,4 y 60 C) por ms de 2 horas. Qu alimentos debo consumir? Frutas Propngase comer el equivalente a 2 tazas de frutas frescas, enlatadas (en su jugo natural) o congeladas cada da. Algunos ejemplos de equivalentes a 1 taza de frutas son 1 manzana pequea, 8 fresas grandes, 1 taza de fruta enlatada,  taza de fruta desecada o 1 taza de jugo 100 %. Verduras Propngase comer el equivalente a 2 o 3 tazas de verduras frescas y congeladas cada da, incluyendo diferentes variedades y colores. Algunos ejemplos de equivalentes a 1 taza de verduras son 2 zanahorias medianas, 2 tazas de verduras de hoja verde crudas, 1 taza de verduras cortadas (crudas o cocidas) o 1 papa mediana al horno. Granos Propngase comer el equivalente a 6 onzas de cereales integrales por da. Algunos ejemplos de equivalentes a 1 onza de cereales son 1 rebanada de pan, 1 taza de cereal listo para comer, 3 tazas de palomitas de maz o  taza de arroz, pasta o cereales cocidos. Carnes y otras protenas Propngase comer el equivalente a 5 o 6 onzas de protena por da. Algunos ejemplos de equivalentes a 1 onza de protena son 1 huevo,  taza de frutos secos o semillas o 1 cucharada (16 g) de mantequilla de man. Un corte de carne o pescado del tamao de un mazo de cartas equivale aproximadamente a 3 a 4 onzas. De las protenas que consume cada semana, intente que al menos 8 onzas provengan de frutos de mar. Esto incluye el salmn, la trucha, el arenque y las anchoas. Lcteos Propngase comer el equivalente a 3 tazas de lcteos descremados o con bajo contenido de grasa cada da. Algunos ejemplos de equivalentes a 1 taza de lcteos son 1 taza (240 ml) de leche, 8  onzas (250 g) de yogur, 1 onzas (44 g) de queso natural o 1 taza (240 ml) de leche de soja fortificada. Grasas y aceites Propngase consumir alrededor de 5 cucharaditas (21 g) por da. Elija grasas monoinsaturadas, como el aceite de canola y de oliva, aguacate, mantequilla de man y la mayora de los frutos secos, o bien grasas poliinsaturadas, como el aceite de girasol, maz y soja, nueces, piones, semillas de ssamo, semillas de girasol y semillas de lino. Bebidas Propngase beber seis vasos de 8 onzas de agua por da. Limite el caf a entre tres y cinco tazas de 8 onzas por da. Limite el consumo de bebidas con cafena que tengan caloras agregadas, como los refrescos y las bebidas energizantes. Limite el consumo de alcohol a no ms de 1 medida por da si es mujer y no est embarazada, y a 2 medidas por da si es hombre. Una medida equivale a 12 onzas de cerveza (355 ml), 5 onzas de vino (148 ml) o 1 onzas de bebidas alcohlicas de alta graduacin (44 ml). Condimentos y otros alimentos Evite agregar cantidades excesivas de sal a los alimentos. Pruebe darles sabor con hierbas y especias en lugar de sal. Evite agregar azcar a los alimentos. Pruebe usar alios, salsas y productos untables a base de aceite en lugar de grasas slidas. Esta informacin se basa en las pautas generales de nutricin de los [place] Para obtener   ms informacin, visite choosemyplate.gov. Las cantidades exactas pueden variar en funcin de sus necesidades nutricionales. Resumen Un plan de alimentacin saludable puede ayudarlo a mantener un peso saludable, reducir el riesgo de tener enfermedades crnicas y mantenerse activo durante toda su vida. Planifique sus comidas. Asegrese de consumir las porciones correctas de una variedad de alimentos ricos en nutrientes. En lugar de frer, trate de cocinar en el horno, en la plancha o en la parrilla, o hervir los alimentos. Elija opciones saludables en todos los mbitos, como en el  hogar, el trabajo, la escuela, los restaurantes y las tiendas. Esta informacin no tiene como fin reemplazar el consejo del mdico. Asegrese de hacerle al mdico cualquier pregunta que tenga. Document Revised: 01/18/2021 Document Reviewed: 01/18/2021 Elsevier Patient Education  2023 Elsevier Inc.  

## 2022-05-12 NOTE — Progress Notes (Signed)
Patient ID: Fred Mullins, male    DOB: [birth year]  MRN: [id number]  CC: Diabetes (DM f/u. Med refill. )   Subjective: Fred Mullins is a 46 y.o. male who presents for chronic ds management His concerns today include:  Patient with history of EtOH use disorder (quit 06/2021), abnormal LFTs, hyperTG, DM type 2    DIABETES TYPE 2 Last A1C:   Results for orders placed or performed in visit on 05/12/22  POCT glucose (manual entry)  Result Value Ref Range   POC Glucose 137 (A) 70 - 99 mg/dl  POCT glycosylated hemoglobin (Hb A1C)  Result Value Ref Range   Hemoglobin A1C     HbA1c POC (<> result, manual entry)     HbA1c, POC (prediabetic range)     HbA1c, POC (controlled diabetic range) 6.3 0.0 - 7.0 %   A1C 6.3 Med Adherence:  not taking Metformin consistently.  Taking only once a wk.  Reported same on last visit.  Reports he was told by others that med can damage the kidneys.  And GFR was normal on chemistry done June of this year. Medication side effects:  _0  Yes    _1  No Home Monitoring?  _2  Yes but not often; about 2 x mth.  BS done today is fasting  Home glucose results range: Diet Adherence: _3  Yes - drinks a Coke occasionally. 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 _7  Yes    _8  No Retinopathy hx? _9  Yes    _10  No Last eye exam:  endorses a little blurred vision.  Has not had eye exam done for this yr.  No insurance Comments:   HL: TG level improved when checked on last visit.  Went from 641 to 209.  LDL was 92.  LFTs normalized. Quit drinking completely since 06/2021.   HM:  given FIT kit on last visit.  He has kit at home.  Will get flu shot elsewhere.     Patient Active Problem List   Diagnosis Date Noted   Alcohol use disorder in remission 11/08/2021   New onset type 2 diabetes mellitus ([place]) 08/31/2020   Abnormal LFTs 08/31/2020   Rectal bleeding 08/31/2020   Influenza vaccine  needed 07/13/2020   Obesity (BMI 30.0-34.9) 07/13/2020   Elevated blood pressure reading without diagnosis of hypertension 07/13/2020   Family history of diabetes mellitus 11/04/2013   Hypertriglyceridemia 11/04/2013   Unspecified vitamin D deficiency 11/04/2013   Hemorrhoid 11/04/2013   Unspecified constipation 11/04/2013   Dental caries 12/16/2012   Hypersomnia with sleep apnea, unspecified 12/15/2012   Allergic rhinitis 12/15/2012   Chronic headaches 12/15/2012     Current Outpatient Medications on File Prior to Visit  Medication Sig Dispense Refill   Blood Glucose Monitoring Suppl (TRUE METRIX METER) w/Device KIT Use as instructed to check blood sugar once daily. 1 kit 0   glucose blood (TRUE METRIX BLOOD GLUCOSE TEST) test strip Use as instructed to check blood sugar once daily. 100 each 2   hydrocortisone (ANUSOL-HC) 25 MG suppository Place 1 suppository (25 mg total) rectally 2 (two) times daily as needed for hemorrhoids or anal itching. 12 suppository 1   metFORMIN (GLUCOPHAGE-XR) 500 MG 24 hr tablet Take 1 tablet (500 mg total) by mouth daily with breakfast. 90 tablet 1   TRUEplus Lancets 28G MISC Use to instructed to check blood sugar once daily. 100 each 2  No current facility-administered medications on file prior to visit.    Allergies  Allergen Reactions   Pollen Extract Other (See Comments)    Unknown    Social History   Socioeconomic History   Marital status: Single    Spouse name: Not on file   Number of children: 4   Years of education: Not on file   Highest education level: Not on file  Occupational History   Not on file  Tobacco Use   Smoking status: Light Smoker    Types: Cigarettes   Smokeless tobacco: Never  Vaping Use   Vaping Use: Never used  Substance and Sexual Activity   Alcohol use: Yes    Comment: occasonally   Drug use: No   Sexual activity: Not on file  Other Topics Concern   Not on file  Social History Narrative   Not on file    Social Determinants of Health   Financial Resource Strain: Not on file  Food Insecurity: Not on file  Transportation Needs: Not on file  Physical Activity: Not on file  Stress: Not on file  Social Connections: Not on file  Intimate Partner Violence: Not on file    Family History  Problem Relation Age of Onset   Hyperlipidemia Mother    Diabetes Father     Past Surgical History:  Procedure Laterality Date   FOOT SURGERY     cyst removed from foot as a child   ORIF ANKLE FRACTURE Left 10/26/2015   Procedure: OPEN REDUCTION INTERNAL FIXATION (ORIF) LEFT FIBULA FRACTURE;  Surgeon: Newt Minion, MD;  Location: [facility];  Service: Orthopedics;  Laterality: Left;   ORIF WRIST FRACTURE Left 12/12/2019   Procedure: OPEN TREATMENT OF LEFT FOREARM FRACTURE;  Surgeon: Milly Jakob, MD;  Location: [facility];  Service: Orthopedics;  Laterality: Left;  REGIONAL/MAC    ROS: Review of Systems Negative except as stated above  PHYSICAL EXAM: BP 121/78 (BP Location: Left Arm, Patient Position: Sitting, Cuff Size: Normal)   Pulse (!) 54   Ht _0  (1.702 m)   Wt 212 lb (96.2 kg)   SpO2 99%   BMI 33.20 kg/m   Wt Readings from Last 3 Encounters:  05/12/22 212 lb (96.2 kg)  11/08/21 211 lb 9.6 oz (96 kg)  07/13/20 217 lb 6.4 oz (98.6 kg)    Physical Exam   General appearance - alert, well appearing, middle-aged Hispanic male and in no distress Mental status - normal mood, behavior, speech, dress, motor activity, and thought processes Neck - supple, no significant adenopathy Chest - clear to auscultation, no wheezes, rales or rhonchi, symmetric air entry Heart - normal rate, regular rhythm, normal S1, S2, no murmurs, rubs, clicks or gallops Extremities - peripheral pulses normal, no pedal edema, no clubbing or cyanosis     Latest Ref Rng & Units 11/08/2021    1:40 PM 07/13/2020   12:07 PM 12/06/2019   10:41 PM  CMP  Glucose 70 - 99 mg/dL 97  106  120   BUN 6 - 24  mg/dL _1 Creatinine 0.76 - 1.27 mg/dL 0.72  0.80  0.85   Sodium 134 - 144 mmol/L 144  140  139   Potassium 3.5 - 5.2 mmol/L 4.4  4.4  3.9   Chloride 96 - 106 mmol/L 105  100  101   CO2 20 - 29 mmol/L _2 Calcium 8.7 - 10.2 mg/dL  8.9  9.4  9.2   Total Protein 6.0 - 8.5 g/dL 7.2  7.5  7.5   Total Bilirubin 0.0 - 1.2 mg/dL 0.7  0.6  0.8   Alkaline Phos 44 - 121 IU/L 87  96  78   AST 0 - 40 IU/L 28  45  45   ALT 0 - 44 IU/L 38  80  71    Lipid Panel     Component Value Date/Time   CHOL 161 11/08/2021 1340   TRIG 209 (H) 11/08/2021 1340   HDL 33 (L) 11/08/2021 1340   CHOLHDL 4.9 11/08/2021 1340   CHOLHDL 4.5 06/19/2014 1223   VLDL 56 (H) 06/19/2014 1223   LDLCALC 92 11/08/2021 1340    CBC    Component Value Date/Time   WBC 5.8 11/08/2021 1340   WBC 6.8 12/06/2019 2241   RBC 5.06 11/08/2021 1340   RBC 4.93 12/06/2019 2241   HGB 14.5 11/08/2021 1340   HCT 43.6 11/08/2021 1340   PLT 197 11/08/2021 1340   MCV 86 11/08/2021 1340   MCH 28.7 11/08/2021 1340   MCH 29.4 12/06/2019 2241   MCHC 33.3 11/08/2021 1340   MCHC 33.7 12/06/2019 2241   RDW 13.2 11/08/2021 1340   LYMPHSABS 2.0 07/13/2020 1207   MONOABS 0.7 06/30/2016 1829   EOSABS 0.2 07/13/2020 1207   BASOSABS 0.1 07/13/2020 1207    ASSESSMENT AND PLAN: 1. Diabetes mellitus type 2 in obese (HCC) A1c is at goal. Since A1c is at goal and he is not taking metformin consistently, I recommend stopping the metformin completely.  Advised patient that his kidney function was good the last time it was checked. Continue to encourage healthy eating habits.  Encouraged him to get in regular exercise but patient states that he is active as he works in [place] glucose (manual entry) - POCT glycosylated hemoglobin (Hb A1C)  2. Mixed hyperlipidemia A1c improved but not at goal.  I recommend starting him on low-dose of atorvastatin to help decrease cardiovascular risks.  Patient is agreeable to doing so.   Advised that once he has been on the medicine for 2 weeks, he should return to the lab to have liver function rechecked. - Hepatic Function Panel; Future - atorvastatin (LIPITOR) 10 MG tablet; Take 1 tablet (10 mg total) by mouth daily.  Dispense: 30 tablet; Refill: 4  3. Influenza vaccine needed Patient states he will get this at an outside event where it will be free for him.  4. Screening for colon cancer Encouraged him to use and turn in the fit test as soon as possible.  He states he will bring it in 2 weeks when he returns to the lab to have LFTs rechecked.   [employer] Language interpreter used during this encounter. #[id number] Maw  Patient was given the opportunity to ask questions.  Patient verbalized understanding of the plan and was able to repeat key elements of the plan.   This documentation was completed using Radio producer.  Any transcriptional errors are unintentional.  Orders Placed This Encounter  Procedures   Hepatic Function Panel   POCT glucose (manual entry)   POCT glycosylated hemoglobin (Hb A1C)     Requested Prescriptions   Signed Prescriptions Disp Refills   atorvastatin (LIPITOR) 10 MG tablet 30 tablet 4    Sig: Take 1 tablet (10 mg total) by mouth daily.    Return in about 4 months (around 09/11/2022).  Karle Plumber, MD, FACP

## 2022-05-13 ENCOUNTER — Other Ambulatory Visit: Payer: Self-pay

## 2022-05-30 ENCOUNTER — Ambulatory Visit: Payer: Self-pay | Attending: Internal Medicine

## 2022-05-30 DIAGNOSIS — E782 Mixed hyperlipidemia: Secondary | ICD-10-CM

## 2022-05-31 LAB — FECAL OCCULT BLOOD, IMMUNOCHEMICAL: Fecal Occult Bld: NEGATIVE

## 2022-05-31 LAB — HEPATIC FUNCTION PANEL
ALT: 44 IU/L (ref 0–44)
AST: 29 IU/L (ref 0–40)
Albumin: 4.8 g/dL (ref 4.1–5.1)
Alkaline Phosphatase: 93 IU/L (ref 44–121)
Bilirubin Total: 0.9 mg/dL (ref 0.0–1.2)
Bilirubin, Direct: 0.2 mg/dL (ref 0.00–0.40)
Total Protein: 7 g/dL (ref 6.0–8.5)

## 2022-09-11 ENCOUNTER — Ambulatory Visit: Payer: Self-pay | Attending: Internal Medicine | Admitting: Internal Medicine

## 2022-09-11 ENCOUNTER — Encounter: Payer: Self-pay | Admitting: Internal Medicine

## 2022-09-11 ENCOUNTER — Other Ambulatory Visit: Payer: Self-pay

## 2022-09-11 VITALS — BP 113/70 | HR 57 | Temp 98.4°F | Ht 67.0 in | Wt 203.0 lb

## 2022-09-11 DIAGNOSIS — E669 Obesity, unspecified: Secondary | ICD-10-CM

## 2022-09-11 DIAGNOSIS — E1169 Type 2 diabetes mellitus with other specified complication: Secondary | ICD-10-CM

## 2022-09-11 DIAGNOSIS — E782 Mixed hyperlipidemia: Secondary | ICD-10-CM

## 2022-09-11 DIAGNOSIS — L989 Disorder of the skin and subcutaneous tissue, unspecified: Secondary | ICD-10-CM

## 2022-09-11 LAB — GLUCOSE, POCT (MANUAL RESULT ENTRY): POC Glucose: 126 mg/dl — AB (ref 70–99)

## 2022-09-11 LAB — POCT GLYCOSYLATED HEMOGLOBIN (HGB A1C): HbA1c, POC (controlled diabetic range): 6.1 % (ref 0.0–7.0)

## 2022-09-11 MED ORDER — ATORVASTATIN CALCIUM 10 MG PO TABS
10.0000 mg | ORAL_TABLET | Freq: Every day | ORAL | 4 refills | Status: DC
  Filled 2022-09-11 – 2022-10-09 (×2): qty 30, 30d supply, fill #0
  Filled 2022-12-18: qty 30, 30d supply, fill #1

## 2022-09-11 MED ORDER — TRUE METRIX BLOOD GLUCOSE TEST VI STRP
1.0000 | ORAL_STRIP | Freq: Every day | 2 refills | Status: AC
  Filled 2022-09-11: qty 100, 100d supply, fill #0
  Filled 2022-12-18: qty 100, 100d supply, fill #1
  Filled 2023-08-21 – 2023-08-24 (×2): qty 100, 100d supply, fill #2

## 2022-09-11 NOTE — Progress Notes (Signed)
Patient ID: Fred Mullins, male    DOB: Jul 03, 1975  MRN: [id number]  CC: Diabetes (DM f/u. Med refill - requesting med for allergies /)   Subjective: Fred Mullins is a 47 y.o. male who presents for chronic disease management; 4 mth f/u. His concerns today include:  Patient with history of EtOH use disorder (quit 06/2021), abnormal LFTs, hyperTG, DM type 2   [employer] Language interpreter used during this encounter. # Adriel, [id number]   DM: Results for orders placed or performed in visit on 09/11/22  POCT glucose (manual entry)  Result Value Ref Range   POC Glucose 126 (A) 70 - 99 mg/dl  POCT glycosylated hemoglobin (Hb A1C)  Result Value Ref Range   Hemoglobin A1C     HbA1c POC (<> result, manual entry)     HbA1c, POC (prediabetic range)     HbA1c, POC (controlled diabetic range) 6.1 0.0 - 7.0 %  Metformin D/C on last visit as he was not taking it consistently and A1C was 6.3. Wgh down 9 lbs since last visit 4 mths ago.  Reports he has decrease amount that he eats.  Active at work doing Aeronautical engineer. Last LFT normal. Still free of alcohol.   Has Lipitor at home, wants to know if he should restart it.  Does not smoke Reports having had eye exam done at [facility] 3 mths ago and no retinopathy  Has 4 bottles of natural supplements under brand name Nutrilite with him which he states are for DM, liver and cholesterol.  He has been taking 1 of each daily.  The names and ingredients are as follows   Glucose Health - containing chromium 100 mcg, Garcinia 300 mg, Gymnema 33.3 mg Garlic Heart Care -continuing garlic powder 600 mg and peppermint 30 mg Liver support detox CoQ 10 -continuing co-Q10 30 mg, vitamin EE 13 mg, Rosemary extract 10 mg  Complains of having a sore clear lesion on the medial aspect of the left heel for about 3 months. Patient Active Problem List   Diagnosis Date Noted   Alcohol use disorder in remission 11/08/2021   New onset type 2 diabetes mellitus  08/31/2020   Abnormal LFTs 08/31/2020   Rectal bleeding 08/31/2020   Influenza vaccine needed 07/13/2020   Obesity (BMI 30.0-34.9) 07/13/2020   Elevated blood pressure reading without diagnosis of hypertension 07/13/2020   Family history of diabetes mellitus 11/04/2013   Hypertriglyceridemia 11/04/2013   Unspecified vitamin D deficiency 11/04/2013   Hemorrhoid 11/04/2013   Unspecified constipation 11/04/2013   Dental caries 12/16/2012   Hypersomnia with sleep apnea, unspecified 12/15/2012   Allergic rhinitis 12/15/2012   Chronic headaches 12/15/2012     Current Outpatient Medications on File Prior to Visit  Medication Sig Dispense Refill   Blood Glucose Monitoring Suppl (TRUE METRIX METER) w/Device KIT Use as instructed to check blood sugar once daily. 1 kit 0   TRUEplus Lancets 28G MISC Use to instructed to check blood sugar once daily. 100 each 2   atorvastatin (LIPITOR) 10 MG tablet Take 1 tablet (10 mg total) by mouth daily. (Patient not taking: Reported on 09/11/2022) 30 tablet 4   No current facility-administered medications on file prior to visit.    Allergies  Allergen Reactions   Pollen Extract Other (See Comments)    Unknown    Social History   Socioeconomic History   Marital status: Single    Spouse name: Not on file   Number of children: 4   Years of  education: Not on file   Highest education level: Not on file  Occupational History   Not on file  Tobacco Use   Smoking status: Light Smoker    Types: Cigarettes   Smokeless tobacco: Never  Vaping Use   Vaping Use: Never used  Substance and Sexual Activity   Alcohol use: Yes    Comment: occasonally   Drug use: No   Sexual activity: Not on file  Other Topics Concern   Not on file  Social History Narrative   Not on file   Social Determinants of Health   Financial Resource Strain: Not on file  Food Insecurity: Not on file  Transportation Needs: Not on file  Physical Activity: Not on file  Stress:  Not on file  Social Connections: Not on file  Intimate Partner Violence: Not on file    Family History  Problem Relation Age of Onset   Hyperlipidemia Mother    Diabetes Father     Past Surgical History:  Procedure Laterality Date   FOOT SURGERY     cyst removed from foot as a child   ORIF ANKLE FRACTURE Left 10/26/2015   Procedure: OPEN REDUCTION INTERNAL FIXATION (ORIF) LEFT FIBULA FRACTURE;  Surgeon: Nadara MustardMarcus Duda V, MD;  Location: [facility] OR;  Service: Orthopedics;  Laterality: Left;   ORIF WRIST FRACTURE Left 12/12/2019   Procedure: OPEN TREATMENT OF LEFT FOREARM FRACTURE;  Surgeon: Mack Hookhompson, David, MD;  Location: [facility];  Service: Orthopedics;  Laterality: Left;  REGIONAL/MAC    ROS: Review of Systems Negative except as stated above  PHYSICAL EXAM: BP 113/70 (BP Location: Left Arm, Patient Position: Sitting, Cuff Size: Large)   Pulse (!) 57   Temp 98.4 F (36.9 C) (Oral)   Ht 5\' 7"  (1.702 m)   Wt 203 lb (92.1 kg)   SpO2 98%   BMI 31.79 kg/m   Wt Readings from Last 3 Encounters:  09/11/22 203 lb (92.1 kg)  05/12/22 212 lb (96.2 kg)  11/08/21 211 lb 9.6 oz (96 kg)    Physical Exam   General appearance - alert, well appearing, and in no distress Mental status - normal mood, behavior, speech, dress, motor activity, and thought processes Neck - supple, no significant adenopathy Chest - clear to auscultation, no wheezes, rales or rhonchi, symmetric air entry Heart - normal rate, regular rhythm, normal S1, S2, no murmurs, rubs, clicks or gallops Extremities - peripheral pulses normal, no pedal edema, no clubbing or cyanosis Left foot: He has a clear but rough appearing lesion measuring about 1 cm in the medial aspect of the left heel    Latest Ref Rng & Units 05/30/2022   12:05 PM 11/08/2021    1:40 PM 07/13/2020   12:07 PM  CMP  Glucose 70 - 99 mg/dL  97  161106   BUN 6 - 24 mg/dL  14  11   Creatinine 0.960.76 - 1.27 mg/dL  0.450.72  4.090.80   Sodium 811134 - 144  mmol/L  144  140   Potassium 3.5 - 5.2 mmol/L  4.4  4.4   Chloride 96 - 106 mmol/L  105  100   CO2 20 - 29 mmol/L  24  23   Calcium 8.7 - 10.2 mg/dL  8.9  9.4   Total Protein 6.0 - 8.5 g/dL 7.0  7.2  7.5   Total Bilirubin 0.0 - 1.2 mg/dL 0.9  0.7  0.6   Alkaline Phos 44 - 121 IU/L 93  87  96   AST 0 - 40 IU/L 29  28  45   ALT 0 - 44 IU/L 44  38  80    Lipid Panel     Component Value Date/Time   CHOL 161 11/08/2021 1340   TRIG 209 (H) 11/08/2021 1340   HDL 33 (L) 11/08/2021 1340   CHOLHDL 4.9 11/08/2021 1340   CHOLHDL 4.5 06/19/2014 1223   VLDL 56 (H) 06/19/2014 1223   LDLCALC 92 11/08/2021 1340    CBC    Component Value Date/Time   WBC 5.8 11/08/2021 1340   WBC 6.8 12/06/2019 2241   RBC 5.06 11/08/2021 1340   RBC 4.93 12/06/2019 2241   HGB 14.5 11/08/2021 1340   HCT 43.6 11/08/2021 1340   PLT 197 11/08/2021 1340   MCV 86 11/08/2021 1340   MCH 28.7 11/08/2021 1340   MCH 29.4 12/06/2019 2241   MCHC 33.3 11/08/2021 1340   MCHC 33.7 12/06/2019 2241   RDW 13.2 11/08/2021 1340   LYMPHSABS 2.0 07/13/2020 1207   MONOABS 0.7 06/30/2016 1829   EOSABS 0.2 07/13/2020 1207   BASOSABS 0.1 07/13/2020 1207    ASSESSMENT AND PLAN:  1. Type 2 diabetes mellitus with obesity At goal.  Commended him on this.  Encouraged him to continue eating smaller portions and trying to move is much as he can.  Continue to observe off medication. - glucose blood (TRUE METRIX BLOOD GLUCOSE TEST) test strip; Use as instructed to check blood sugar once daily.  Dispense: 100 each; Refill: 2 - POCT glucose (manual entry) - POCT glycosylated hemoglobin (Hb A1C)  2. Mixed hyperlipidemia His last LDL being 92 was not at goal.  Given that his LFTs have been normal on 2 consecutive checks, I recommend restarting Lipitor which he has at home.  RF also sent to pharmacy  3. Lesion of skin of foot - Ambulatory referral to Podiatry  In regards to his nitrile vitamins, patient informed that these are not  regulated by the FDA and so there is no guarantee that they contain what is labeled on the bottle.  I recommend that he stop the 1 that is for liver detox.  Patient was given the opportunity to ask questions.  Patient verbalized understanding of the plan and was able to repeat key elements of the plan.   This documentation was completed using Paediatric nurse.  Any transcriptional errors are unintentional.  Orders Placed This Encounter  Procedures   Ambulatory referral to Podiatry   POCT glucose (manual entry)   POCT glycosylated hemoglobin (Hb A1C)     Requested Prescriptions   Signed Prescriptions Disp Refills   glucose blood (TRUE METRIX BLOOD GLUCOSE TEST) test strip 100 each 2    Sig: Use as instructed to check blood sugar once daily.    Return in about 4 months (around 01/11/2023) for Give lab appt in 1 mth.  Sign release to get eye exam from [facility]  Jonah Blue, MD, FACP

## 2022-09-11 NOTE — Addendum Note (Signed)
Addended by: Jonah Blue B on: 09/11/2022 01:10 PM   Modules accepted: Orders

## 2022-09-17 ENCOUNTER — Other Ambulatory Visit: Payer: Self-pay

## 2022-10-03 ENCOUNTER — Ambulatory Visit (INDEPENDENT_AMBULATORY_CARE_PROVIDER_SITE_OTHER): Payer: Self-pay | Admitting: Podiatry

## 2022-10-03 DIAGNOSIS — B070 Plantar wart: Secondary | ICD-10-CM

## 2022-10-03 NOTE — Progress Notes (Signed)
   Chief Complaint  Patient presents with   Plantar Warts    Patient came in today for left foot plantar wart, medial side of the heel, started 3 months ago, patient states he has very little pain, Diabetic A1c-6.1 BG- not taking     Subjective: 47 y.o. male presenting today as a new patient for evaluation of a symptomatic callus to the plantar medial aspect of the left heel that is been present for a few months now.  Minimal pain.  He has noticed callus tissue develop and peeling of skin.  He presents for further treatment and evaluation   Past Medical History:  Diagnosis Date   Allergic rhinitis 12/15/2012   Hyperlipemia    Kidney stones    Umbilical hernia    Varicose veins     Objective: Physical Exam General: The patient is alert and oriented x3 in no acute distress.   Dermatology: Hyperkeratotic skin lesion(s) noted to the plantar aspect of the left foot approximately 1 cm in diameter. Pinpoint bleeding noted upon debridement. Skin is warm, dry and supple bilateral lower extremities. Negative for open lesions or macerations.   Vascular: Palpable pedal pulses bilaterally. No edema or erythema noted. Capillary refill within normal limits.   Neurological: Epicritic and protective threshold grossly intact bilaterally.    Musculoskeletal Exam: Pain on palpation to the noted skin lesion(s).  Range of motion within normal limits to all pedal and ankle joints bilateral. Muscle strength 5/5 in all groups bilateral.    Assessment: #1 plantar wart left plantar medial heel   Plan of Care:  #1 Patient was evaluated. #2 Excisional debridement of the plantar wart lesion(s) was performed using a chisel blade.  Salicylic acid was applied and the lesion(s) was dressed with a dry sterile dressing. #3  Small sample Salinocaine was provided for the patient to apply daily x 3-4 weeks to allow additional verruca tissue to peel off #4 return to clinic as needed  Felecia Shelling, DPM [facility]  Dr. Felecia Shelling, DPM    [address]                                [place], [place] [address]                Office [phone]  Fax [phone]

## 2022-10-09 ENCOUNTER — Other Ambulatory Visit: Payer: Self-pay

## 2022-10-13 ENCOUNTER — Ambulatory Visit: Payer: Self-pay | Attending: Family Medicine

## 2022-10-13 ENCOUNTER — Other Ambulatory Visit: Payer: Self-pay | Admitting: Internal Medicine

## 2022-10-13 DIAGNOSIS — E669 Obesity, unspecified: Secondary | ICD-10-CM

## 2022-10-13 DIAGNOSIS — E782 Mixed hyperlipidemia: Secondary | ICD-10-CM

## 2022-10-14 LAB — COMPREHENSIVE METABOLIC PANEL
ALT: 26 IU/L (ref 0–44)
AST: 19 IU/L (ref 0–40)
Albumin/Globulin Ratio: 2.2 (ref 1.2–2.2)
Albumin: 4.7 g/dL (ref 4.1–5.1)
Alkaline Phosphatase: 85 IU/L (ref 44–121)
BUN/Creatinine Ratio: 24 — ABNORMAL HIGH (ref 9–20)
BUN: 18 mg/dL (ref 6–24)
Bilirubin Total: 0.4 mg/dL (ref 0.0–1.2)
CO2: 22 mmol/L (ref 20–29)
Calcium: 9.2 mg/dL (ref 8.7–10.2)
Chloride: 103 mmol/L (ref 96–106)
Creatinine, Ser: 0.74 mg/dL — ABNORMAL LOW (ref 0.76–1.27)
Globulin, Total: 2.1 g/dL (ref 1.5–4.5)
Glucose: 114 mg/dL — ABNORMAL HIGH (ref 70–99)
Potassium: 4.5 mmol/L (ref 3.5–5.2)
Sodium: 142 mmol/L (ref 134–144)
Total Protein: 6.8 g/dL (ref 6.0–8.5)
eGFR: 112 mL/min/{1.73_m2} (ref >59)

## 2022-10-14 LAB — LIPID PANEL
Chol/HDL Ratio: 4.2 ratio (ref 0.0–5.0)
Cholesterol, Total: 165 mg/dL (ref 100–199)
HDL: 39 mg/dL — ABNORMAL LOW (ref >39)
LDL Chol Calc (NIH): 99 mg/dL (ref 0–99)
Triglycerides: 156 mg/dL — ABNORMAL HIGH (ref 0–149)
VLDL Cholesterol Cal: 27 mg/dL (ref 5–40)

## 2022-12-17 ENCOUNTER — Ambulatory Visit: Payer: Self-pay | Admitting: Physician Assistant

## 2022-12-18 ENCOUNTER — Other Ambulatory Visit: Payer: Self-pay

## 2022-12-18 ENCOUNTER — Other Ambulatory Visit: Payer: Self-pay | Admitting: Internal Medicine

## 2022-12-18 DIAGNOSIS — E669 Obesity, unspecified: Secondary | ICD-10-CM

## 2022-12-23 ENCOUNTER — Other Ambulatory Visit: Payer: Self-pay

## 2023-01-13 ENCOUNTER — Encounter: Payer: Self-pay | Admitting: Internal Medicine

## 2023-01-13 ENCOUNTER — Ambulatory Visit: Payer: Self-pay | Attending: Internal Medicine | Admitting: Internal Medicine

## 2023-01-13 VITALS — BP 108/71 | HR 57 | Temp 98.3°F | Ht 67.0 in | Wt 206.0 lb

## 2023-01-13 DIAGNOSIS — F5101 Primary insomnia: Secondary | ICD-10-CM

## 2023-01-13 DIAGNOSIS — E1169 Type 2 diabetes mellitus with other specified complication: Secondary | ICD-10-CM

## 2023-01-13 DIAGNOSIS — E669 Obesity, unspecified: Secondary | ICD-10-CM

## 2023-01-13 DIAGNOSIS — E119 Type 2 diabetes mellitus without complications: Secondary | ICD-10-CM

## 2023-01-13 DIAGNOSIS — E785 Hyperlipidemia, unspecified: Secondary | ICD-10-CM

## 2023-01-13 DIAGNOSIS — J069 Acute upper respiratory infection, unspecified: Secondary | ICD-10-CM

## 2023-01-13 LAB — POCT GLYCOSYLATED HEMOGLOBIN (HGB A1C): HbA1c, POC (controlled diabetic range): 6.2 % (ref 0.0–7.0)

## 2023-01-13 LAB — GLUCOSE, POCT (MANUAL RESULT ENTRY): POC Glucose: 120 mg/dl — AB (ref 70–99)

## 2023-01-13 NOTE — Progress Notes (Signed)
Patient ID: Fred Mullins, male    DOB: 03-Sep-1975  MRN: [id number]  CC: Diabetes (DM f/u./Trouble sleeping X1 yr/Discuss atorvastatin and metformin)   Subjective: Fred Mullins is a 47 y.o. male who presents for chronic ds management His concerns today include:  Patient with history of EtOH use disorder (quit 06/2021), abnormal LFTs, hyperTG, DM type 2, former smoker   [employer] Language interpreter used during this encounter. #Jessie [id number]  Main concern is problems falling sleeping x 2-3 mths Denies any increase stress at home or work that may be contributing. Gets in bed around 12 a.m and wakes up at 7 a.m. Turns off all lights/sounds once in bed Does not drink ETOH beverages at bedtime Sometimes he would have coffee around 8 p.m but not often. He was taking Melatonin one pill at bedtime occasionally.  Does help.   C/o dry/wet cough a few days ago and pain over RT shoulder.  No fever or SOB.  Cough residual now.  Using Robitussin which has helped a lot.  Does not smoke.  Quit 2 yrs ago.  DM: Results for orders placed or performed in visit on 01/13/23  POCT glucose (manual entry)  Result Value Ref Range   POC Glucose 120 (A) 70 - 99 mg/dl  B1Y is 6.2. Diet control.  Metformin d/c on visit in 05/2022 Doing well with eating habits.  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  LDL was 99. Reports he took what he had left then stopped taking.  No significant S.E.  Wants to know how long he would have to take it. LFTs drawn a few wks after restarting med was nl.     Patient Active Problem List   Diagnosis Date Noted   Alcohol use disorder in remission 11/08/2021   New onset type 2 diabetes mellitus (HCC) 08/31/2020   Abnormal LFTs 08/31/2020   Rectal bleeding 08/31/2020   Influenza vaccine needed 07/13/2020   Obesity (BMI 30.0-34.9) 07/13/2020   Elevated blood pressure reading without diagnosis of  hypertension 07/13/2020   Family history of diabetes mellitus 11/04/2013   Hypertriglyceridemia 11/04/2013   Unspecified vitamin D deficiency 11/04/2013   Hemorrhoid 11/04/2013   Unspecified constipation 11/04/2013   Dental caries 12/16/2012   Hypersomnia with sleep apnea, unspecified 12/15/2012   Allergic rhinitis 12/15/2012   Chronic headaches 12/15/2012     Current Outpatient Medications on File Prior to Visit  Medication Sig Dispense Refill   Blood Glucose Monitoring Suppl (TRUE METRIX METER) w/Device KIT Use as instructed to check blood sugar once daily. 1 kit 0   glucose blood (TRUE METRIX BLOOD GLUCOSE TEST) test strip Use as instructed to check blood sugar once daily. 100 each 2   TRUEplus Lancets 28G MISC Use to instructed to check blood sugar once daily. 100 each 2   atorvastatin (LIPITOR) 10 MG tablet Take 1 tablet (10 mg total) by mouth daily. (Patient not taking: Reported on 01/13/2023) 30 tablet 4   No current facility-administered medications on file prior to visit.    Allergies  Allergen Reactions   Pollen Extract Other (See Comments)    Unknown    Social History   Socioeconomic History   Marital status: Single    Spouse name: Not on file   Number of children: 4   Years of education: Not on file   Highest education level: Not on file  Occupational History  Not on file  Tobacco Use   Smoking status: Former    Current packs/day: 0.00    Types: Cigarettes    Quit date: 2022    Years since quitting: 2.6   Smokeless tobacco: Never  Vaping Use   Vaping status: Never Used  Substance and Sexual Activity   Alcohol use: Yes    Comment: occasonally   Drug use: No   Sexual activity: Not on file  Other Topics Concern   Not on file  Social History Narrative   Not on file   Social Determinants of Health   Financial Resource Strain: Not on file  Food Insecurity: Not on file  Transportation Needs: Not on file  Physical Activity: Not on file  Stress: Not  on file  Social Connections: Not on file  Intimate Partner Violence: Not on file    Family History  Problem Relation Age of Onset   Hyperlipidemia Mother    Diabetes Father     Past Surgical History:  Procedure Laterality Date   FOOT SURGERY     cyst removed from foot as a child   ORIF ANKLE FRACTURE Left 10/26/2015   Procedure: OPEN REDUCTION INTERNAL FIXATION (ORIF) LEFT FIBULA FRACTURE;  Surgeon: Nadara Mustard, MD;  Location: [facility] OR;  Service: Orthopedics;  Laterality: Left;   ORIF WRIST FRACTURE Left 12/12/2019   Procedure: OPEN TREATMENT OF LEFT FOREARM FRACTURE;  Surgeon: Mack Hook, MD;  Location: [facility];  Service: Orthopedics;  Laterality: Left;  REGIONAL/MAC    ROS: Review of Systems Negative except as stated above  PHYSICAL EXAM: BP 108/71 (BP Location: Left Arm, Patient Position: Sitting, Cuff Size: Normal)   Pulse (!) 57   Temp 98.3 F (36.8 C) (Oral)   Ht 5\' 7"  (1.702 m)   Wt 206 lb (93.4 kg)   SpO2 98%   BMI 32.26 kg/m   Wt Readings from Last 3 Encounters:  01/13/23 206 lb (93.4 kg)  09/11/22 203 lb (92.1 kg)  05/12/22 212 lb (96.2 kg)    Physical Exam   General appearance - alert, well appearing, middle age Hispanic male and in no distress Nose - normal and patent, no erythema, discharge or polyps Mouth - mucous membranes moist, pharynx normal without lesions Neck - supple, no significant adenopathy Chest - clear to auscultation, no wheezes, rales or rhonchi, symmetric air entry Heart - normal rate, regular rhythm, normal S1, S2, no murmurs, rubs, clicks or gallops Extremities - peripheral pulses normal, no pedal edema, no clubbing or cyanosis     Latest Ref Rng & Units 10/13/2022    8:59 AM 05/30/2022   12:05 PM 11/08/2021    1:40 PM  CMP  Glucose 70 - 99 mg/dL 161   97   BUN 6 - 24 mg/dL 18   14   Creatinine 0.96 - 1.27 mg/dL 0.45   4.09   Sodium 811 - 144 mmol/L 142   144   Potassium 3.5 - 5.2 mmol/L 4.5   4.4    Chloride 96 - 106 mmol/L 103   105   CO2 20 - 29 mmol/L 22   24   Calcium 8.7 - 10.2 mg/dL 9.2   8.9   Total Protein 6.0 - 8.5 g/dL 6.8  7.0  7.2   Total Bilirubin 0.0 - 1.2 mg/dL 0.4  0.9  0.7   Alkaline Phos 44 - 121 IU/L 85  93  87   AST 0 - 40 IU/L 19  29  28   ALT 0 - 44 IU/L 26  44  38    Lipid Panel     Component Value Date/Time   CHOL 165 10/13/2022 0859   TRIG 156 (H) 10/13/2022 0859   HDL 39 (L) 10/13/2022 0859   CHOLHDL 4.2 10/13/2022 0859   CHOLHDL 4.5 06/19/2014 1223   VLDL 56 (H) 06/19/2014 1223   LDLCALC 99 10/13/2022 0859    CBC    Component Value Date/Time   WBC 5.8 11/08/2021 1340   WBC 6.8 12/06/2019 2241   RBC 5.06 11/08/2021 1340   RBC 4.93 12/06/2019 2241   HGB 14.5 11/08/2021 1340   HCT 43.6 11/08/2021 1340   PLT 197 11/08/2021 1340   MCV 86 11/08/2021 1340   MCH 28.7 11/08/2021 1340   MCH 29.4 12/06/2019 2241   MCHC 33.3 11/08/2021 1340   MCHC 33.7 12/06/2019 2241   RDW 13.2 11/08/2021 1340   LYMPHSABS 2.0 07/13/2020 1207   MONOABS 0.7 06/30/2016 1829   EOSABS 0.2 07/13/2020 1207   BASOSABS 0.1 07/13/2020 1207    ASSESSMENT AND PLAN: 1. Type 2 diabetes mellitus with obesity (HCC) .  Continue healthy eating habits.  Continue to move is much as he can.  His job is very physical. - POCT glycosylated hemoglobin (Hb A1C) - POCT glucose (manual entry) - Microalbumin / creatinine urine ratio  2. Diabetes mellitus type 2, diet-controlled (HCC) See #1 above  3. Primary insomnia Good sleep hygiene discussed and encouraged. Patient advised not to drink any caffeinated beverages or excessive alcohol use within several hours of bedtime.  Advised to get in bed around about the same time every night.  Once in bed, turn off all lights and sounds.  If unable to fall asleep within 30 to 45 minutes of getting in bed, patient should get up and try to do something until he feels sleepy again.  At that time try getting back in bed. Advised that he can take  melatonin 3 mg at bedtime if he has found this to be helpful.  4. Viral upper respiratory tract infection Improving.  Does not need any medication at this time.  5. Hyperlipidemia associated with type 2 diabetes mellitus (HCC) Advised patient that having diabetes increases the risk for heart attack and strokes.  Goal for LDL cholesterol is less than 70.  Not at goal on last visit.  Encouraged him to take the atorvastatin as prescribed.     Patient was given the opportunity to ask questions.  Patient verbalized understanding of the plan and was able to repeat key elements of the plan.   This documentation was completed using Paediatric nurse.  Any transcriptional errors are unintentional.  Orders Placed This Encounter  Procedures   Microalbumin / creatinine urine ratio   POCT glycosylated hemoglobin (Hb A1C)   POCT glucose (manual entry)     Requested Prescriptions    No prescriptions requested or ordered in this encounter    Return in about 4 months (around 05/15/2023).  Jonah Blue, MD, FACP

## 2023-01-13 NOTE — Patient Instructions (Signed)
Insomnio Insomnia El insomnio es un trastorno del sueo que causa dificultades para conciliar el sueo o para mantenerlo. Puede producir fatiga, falta de energa, dificultad para concentrarse, cambios en el estado de nimo y mal rendimiento escolar o laboral. Hay tres formas diferentes de clasificar el insomnio: Dificultad para conciliar el sueo. Dificultad para mantener el sueo. Despertar muy precoz por la maana. Cualquier tipo de insomnio puede ser a largo plazo (crnico) o a corto plazo (agudo). Ambos son frecuentes. Generalmente, el insomnio a corto plazo dura 3 meses o menos tiempo. El insomnio crnico ocurre al menos tres veces por semana durante ms de 3 meses. Cules son las causas? El insomnio puede deberse a otra afeccin, situacin o sustancia, por ejemplo: Tener ciertas afecciones de salud mental, como ansiedad y depresin. Consumir cafena, alcohol, tabaco o drogas. Tener afecciones gastrointestinales, como enfermedad de reflujo gastroesofgico (ERGE). Tener ciertas afecciones. Estas incluyen las siguientes: Asma. Enfermedad de Alzheimer. Accidente cerebrovascular. Dolor crnico. Hiperactividad de la glndula tiroidea (hipertiroidismo). Otros trastornos del sueo, como sndrome de las piernas inquietas y apnea del sueo. Menopausia. En ocasiones, la causa del insomnio puede ser desconocida. Qu incrementa el riesgo? Los factores de riesgo de tener insomnio incluyen lo siguiente: Sexo. Esta afeccin es ms frecuente en las mujeres que en los hombres. Edad. El insomnio es ms frecuente a medida que las personas envejecen. El estrs y ciertas afecciones de salud mental y mdicas. Falta de actividad fsica. Tener un horario de trabajo irregular. Esto puede incluir trabajar turnos nocturnos y viajar entre diferentes zonas horarias. Cules son los signos o sntomas? Si tiene insomnio, el sntoma principal es la dificultad para conciliar el sueo o mantenerlo. Esto puede  derivar en otros sntomas, por ejemplo: Sentirse cansado o tener poca energa. Ponerse nervioso por tener que irse a dormir. No sentirse descansado por la maana. Tener dificultad para concentrarse. Sentirse irritable, ansioso o deprimido. Cmo se diagnostica? Esta afeccin se puede diagnosticar en funcin de lo siguiente: Los sntomas y antecedentes mdicos. El mdico puede hacerle preguntas sobre: Hbitos de sueo. Cualquier afeccin mdica que tenga. La salud mental. Un examen fsico. Cmo se trata? El tratamiento para el insomnio depende de la causa. El tratamiento puede centrarse en tratar una afeccin subyacente que causa el insomnio. El tratamiento tambin puede incluir lo siguiente: Medicamentos que lo ayuden a dormir. Asesoramiento psicolgico o terapia. Ajustes en el estilo de vida para ayudarlo a dormir mejor. Siga estas indicaciones en su casa: Comida y bebida  Limite o evite el consumo de alcohol, bebidas con cafena y productos que contengan nicotina y tabaco, especialmente cerca de la hora de acostarse. Estos productos pueden perturbarle el sueo. No consuma una comida suculenta ni coma alimentos condimentados justo antes de la hora de acostarse. Esto puede causarle molestias digestivas y dificultades para dormir. Hbitos de sueo  Lleve un registro del sueo ya que podra ser de utilidad para que usted y a su mdico puedan determinar qu podra estar causndole insomnio. Escriba los siguientes datos: Cundo duerme. Cundo se despierta durante la noche. Qu tan bien duerme y si se siente descansado al da siguiente. Cualquier efecto secundario de los medicamentos que toma. Lo que usted come y bebe. Convierta su habitacin en un lugar oscuro, cmodo donde sea fcil conciliar el sueo. Coloque persianas o cortinas oscuras que impidan la entrada de la luz del exterior. Para bloquear los ruidos, use un aparato que reproduzca sonidos ambientales o relajantes de  fondo. Mantenga baja la temperatura. Limite el uso de   pantallas antes de la hora de [place]. Esto incluye: No ver televisin. No usar el telfono inteligente, la tableta o la computadora. Siga una rutina que incluya ir a dormir y Chiropodist a la misma hora cada da y noche. Esto puede ayudarlo a conciliar el sueo ms rpidamente. Considere realizar [employer] tranquila, como leer, e incorporarla como parte de la rutina a la hora de irse a dormir. Trate de evitar tomar siestas durante el da para que pueda dormir mejor por la noche. Levntese de la cama si sigue despierto despus de 15 minutos de haber intentado dormirse. Mantenga bajas las luces, pero intente leer o hacer una actividad tranquila. Cuando tenga sueo, regrese a Pharmacist, hospital. Indicaciones generales Use los medicamentos de venta libre y los recetados solamente como se lo haya indicado el mdico. Haga actividad fsica habitualmente como se lo haya indicado el mdico. Sin embargo, evite hacer actividad fsica las horas antes de [place]. Utilice tcnicas de relajacin para controlar el estrs. Pdale al mdico que le sugiera algunas tcnicas que sean adecuadas para usted. Pueden incluir: Ejercicios de respiracin. Rutinas para aliviar la tensin muscular. Visualizacin de escenas apacibles. Conduzca con cuidado. No conduzca si se siente muy somnoliento. Concurra a todas las visitas de seguimiento. Esto es importante. Comunquese con un mdico si: Est cansado durante todo Medical laboratory scientific officer. Tiene dificultad en su rutina diaria debido a la somnolencia. Sigue teniendo problemas para dormir o Teacher, English as a foreign language. Solicite ayuda de inmediato si: Piensa en lastimarse a usted mismo o a Engineer, maintenance (IT). Busque ayuda de inmediato si alguna vez siente que puede hacerse dao a usted mismo o a otros, o tiene pensamientos de Patent examiner a su vida. Dirjase al centro de urgencias ms cercano o: Llame al 911. Llame a National Suicide Prevention Lifeline (Lnea  Telefnica Nacional para la Prevencin del Suicidio) al [phone] o al 988. Est disponible las 24 horas del da. Enve un mensaje de texto a la lnea para casos de crisis al [phone]. Resumen El insomnio es un trastorno del sueo que causa dificultades para conciliar el sueo o para [place]. El insomnio puede ser a Air cabin crew (crnico) o a Product manager (agudo). El tratamiento para el insomnio depende de la causa. El tratamiento puede centrarse en tratar una afeccin subyacente que causa el insomnio. Lleve un registro del sueo ya que podra ser de utilidad para que usted y a su mdico puedan determinar qu podra estar causndole insomnio. Esta informacin no tiene Theme park manager el consejo del mdico. Asegrese de hacerle al mdico cualquier pregunta que tenga. Document Revised: 05/30/2021 Document Reviewed: 05/30/2021 Elsevier Patient Education  2024 [employer].

## 2023-04-07 ENCOUNTER — Ambulatory Visit: Payer: Self-pay

## 2023-04-07 NOTE — Telephone Encounter (Signed)
  Chief Complaint: Eye pain - possible object in eye Symptoms: Pain, blurry vision Frequency: yesterday Pertinent Negatives: Patient denies  Disposition: [] ED /[x] Urgent Care (no appt availability in office) / [] Appointment(In office/virtual)/ []  [facility]/ [] Home Care/ [] Refused Recommended Disposition /[] [facility]/ []  Follow-up with PCP Additional Notes: Spoke with pt's wife. She is not on DPR , and pt is not with her. She states that pt was blowing leaves in the yard when something went into his eye. Pt continues to have blurry vision and pain. Pt will go to UC this afternoon  for care. No appts in office.    Reason for Disposition  [1] Blurred vision AND [2] persists > 1 hour since irrigation (regardless of duration of flushing)  Answer Assessment - Initial Assessment Questions 1. TYPE OF FOREIGN BODY: "What got in the eye?"      right 2. ONSET: "When did it happen?"      Yesterday afternoon 3. MECHANISM: "How did it happen?"      blowing 4. VISION: "Do you have blurred vision?"      Blurry and painful 5. PAIN: "Is it painful?" If Yes, ask: "How bad is the pain?"  (Scale 1-10; or mild, moderate, severe)     5/10 6. CONTACTS: "Do you wear contacts?"     no 7. OTHER SYMPTOMS: "Do you have any other symptoms?"     *No Answer*  Protocols used: Eye - Foreign Body-A-AH

## 2023-05-15 ENCOUNTER — Other Ambulatory Visit: Payer: Self-pay

## 2023-05-15 ENCOUNTER — Ambulatory Visit: Payer: Self-pay | Attending: Internal Medicine | Admitting: Internal Medicine

## 2023-05-15 VITALS — BP 120/75 | HR 56 | Temp 98.2°F | Ht 67.0 in | Wt 207.0 lb

## 2023-05-15 DIAGNOSIS — Z1211 Encounter for screening for malignant neoplasm of colon: Secondary | ICD-10-CM

## 2023-05-15 DIAGNOSIS — E66811 Obesity, class 1: Secondary | ICD-10-CM

## 2023-05-15 DIAGNOSIS — E669 Obesity, unspecified: Secondary | ICD-10-CM

## 2023-05-15 DIAGNOSIS — Z6832 Body mass index (BMI) 32.0-32.9, adult: Secondary | ICD-10-CM

## 2023-05-15 DIAGNOSIS — L729 Follicular cyst of the skin and subcutaneous tissue, unspecified: Secondary | ICD-10-CM

## 2023-05-15 DIAGNOSIS — R200 Anesthesia of skin: Secondary | ICD-10-CM

## 2023-05-15 DIAGNOSIS — E1169 Type 2 diabetes mellitus with other specified complication: Secondary | ICD-10-CM

## 2023-05-15 DIAGNOSIS — Z2821 Immunization not carried out because of patient refusal: Secondary | ICD-10-CM

## 2023-05-15 DIAGNOSIS — E785 Hyperlipidemia, unspecified: Secondary | ICD-10-CM

## 2023-05-15 LAB — POCT GLYCOSYLATED HEMOGLOBIN (HGB A1C): HbA1c, POC (controlled diabetic range): 6.3 % (ref 0.0–7.0)

## 2023-05-15 LAB — GLUCOSE, POCT (MANUAL RESULT ENTRY): POC Glucose: 120 mg/dL — AB (ref 70–99)

## 2023-05-15 MED ORDER — ATORVASTATIN CALCIUM 10 MG PO TABS
10.0000 mg | ORAL_TABLET | Freq: Every day | ORAL | 4 refills | Status: DC
Start: 2023-05-15 — End: 2024-01-30
  Filled 2023-05-15: qty 30, 30d supply, fill #0
  Filled 2023-06-23 (×2): qty 30, 30d supply, fill #1
  Filled 2023-08-21: qty 30, 30d supply, fill #2
  Filled 2023-08-24: qty 90, 90d supply, fill #2

## 2023-05-15 NOTE — Progress Notes (Signed)
Patient ID: Furious Writer, male    DOB: [birth year]  MRN: [id number]  CC: Diabetes (Dm f/u. Nicki Reaper not taking atorvastatin X1/Discuss metformin/Discuss flu vax.)   Subjective: Fred Mullins is a 47 y.o. male who presents for chronic ds management. His concerns today include:  Patient with history of EtOH use disorder (quit 06/2021), abnormal LFTs, hyperTG, DM type 2, former smoker   [employer] Language interpreter used during this encounter. Percell Belt [id number]  DM: Results for orders placed or performed in visit on 05/15/23  POCT glucose (manual entry)   Collection Time: 05/15/23  9:42 AM  Result Value Ref Range   POC Glucose 120 (A) 70 - 99 mg/dl  POCT glycosylated hemoglobin (Hb A1C)   Collection Time: 05/15/23  9:47 AM  Result Value Ref Range   Hemoglobin A1C     HbA1c POC (<> result, manual entry)     HbA1c, POC (prediabetic range)     HbA1c, POC (controlled diabetic range) 6.3 0.0 - 7.0 %  A1c on last visit was 6.2. Remains diet control. Metformin discontinued a year ago. Checks BS infrequently.  Gives range 150 and lower Eating habits: does not eat tortias any more.  Drinks mainly water and coffee with a little sugar. Exercise: Works in Aeronautical engineer. Due for DM eye exam. [facility]. HL: Advised to restart Lipitor 10 mg daily on last visit. Did restart it but ran out.  C/o numbness b/w shoulder blades intermittently x 2 yrs.  No associated pain.  No radiation. Most noticeable when he sits for a long time; goes away when he lays down to rest. [place] to occur occasionally but now more frequent.   Reports having a ball on LT side of mid back x 1 yr.  Goes and comes.  Not painful; not increasing in size  HM:  declines flu.  Due for colon CA screen Patient Active Problem List   Diagnosis Date Noted   Alcohol use disorder in remission 11/08/2021   New onset type 2 diabetes mellitus (HCC) 08/31/2020   Abnormal LFTs 08/31/2020   Rectal bleeding 08/31/2020    Influenza vaccine needed 07/13/2020   Obesity (BMI 30.0-34.9) 07/13/2020   Elevated blood pressure reading without diagnosis of hypertension 07/13/2020   Family history of diabetes mellitus 11/04/2013   Hypertriglyceridemia 11/04/2013   Vitamin D deficiency 11/04/2013   Hemorrhoid 11/04/2013   Constipation 11/04/2013   Dental caries 12/16/2012   Hypersomnia with sleep apnea 12/15/2012   Allergic rhinitis 12/15/2012   Chronic headaches 12/15/2012     Current Outpatient Medications on File Prior to Visit  Medication Sig Dispense Refill   Blood Glucose Monitoring Suppl (TRUE METRIX METER) w/Device KIT Use as instructed to check blood sugar once daily. 1 kit 0   glucose blood (TRUE METRIX BLOOD GLUCOSE TEST) test strip Use as instructed to check blood sugar once daily. 100 each 2   TRUEplus Lancets 28G MISC Use to instructed to check blood sugar once daily. 100 each 2   No current facility-administered medications on file prior to visit.    Allergies  Allergen Reactions   Pollen Extract Other (See Comments)    Unknown    Social History   Socioeconomic History   Marital status: Single    Spouse name: Not on file   Number of children: 4   Years of education: Not on file   Highest education level: Not on file  Occupational History   Not on file  Tobacco Use  Smoking status: Former    Current packs/day: 0.00    Types: Cigarettes    Quit date: 2022    Years since quitting: 2.9   Smokeless tobacco: Never  Vaping Use   Vaping status: Never Used  Substance and Sexual Activity   Alcohol use: Yes    Comment: occasonally   Drug use: No   Sexual activity: Not on file  Other Topics Concern   Not on file  Social History Narrative   Not on file   Social Drivers of Health   Financial Resource Strain: Low Risk  (05/15/2023)   Overall Financial Resource Strain (CARDIA)    Difficulty of Paying Living Expenses: Not very hard  Food Insecurity: No Food Insecurity (05/15/2023)    Hunger Vital Sign    Worried About Running Out of Food in the Last Year: Never true    Ran Out of Food in the Last Year: Never true  Transportation Needs: No Transportation Needs (05/15/2023)   PRAPARE - Administrator, Civil Service (Medical): No    Lack of Transportation (Non-Medical): No  Physical Activity: Inactive (05/15/2023)   Exercise Vital Sign    Days of Exercise per Week: 0 days    Minutes of Exercise per Session: 0 min  Stress: No Stress Concern Present (05/15/2023)   [employer] of Occupational Health - Occupational Stress Questionnaire    Feeling of Stress : Not at all  Social Connections: Socially Integrated (05/15/2023)   Social Connection and Isolation Panel [NHANES]    Frequency of Communication with Friends and Family: More than three times a week    Frequency of Social Gatherings with Friends and Family: Once a week    Attends Religious Services: More than 4 times per year    Active Member of [employer] or Organizations: Yes    Attends Engineer, structural: More than 4 times per year    Marital Status: Married  Catering manager Violence: Not At Risk (05/15/2023)   Humiliation, Afraid, Rape, and Kick questionnaire    Fear of Current or Ex-Partner: No    Emotionally Abused: No    Physically Abused: No    Sexually Abused: No    Family History  Problem Relation Age of Onset   Hyperlipidemia Mother    Diabetes Father     Past Surgical History:  Procedure Laterality Date   FOOT SURGERY     cyst removed from foot as a child   ORIF ANKLE FRACTURE Left 10/26/2015   Procedure: OPEN REDUCTION INTERNAL FIXATION (ORIF) LEFT FIBULA FRACTURE;  Surgeon: Nadara Mustard, MD;  Location: [facility] OR;  Service: Orthopedics;  Laterality: Left;   ORIF WRIST FRACTURE Left 12/12/2019   Procedure: OPEN TREATMENT OF LEFT FOREARM FRACTURE;  Surgeon: Mack Hook, MD;  Location: [facility];  Service: Orthopedics;  Laterality: Left;  REGIONAL/MAC     ROS: Review of Systems Negative except as stated above  PHYSICAL EXAM: BP 120/75 (BP Location: Left Arm, Patient Position: Sitting, Cuff Size: Normal)   Pulse (!) 56   Temp 98.2 F (36.8 C) (Oral)   Ht 5\' 7"  (1.702 m)   Wt 207 lb (93.9 kg)   SpO2 98%   BMI 32.42 kg/m   Wt Readings from Last 3 Encounters:  05/15/23 207 lb (93.9 kg)  01/13/23 206 lb (93.4 kg)  09/11/22 203 lb (92.1 kg)    Physical Exam  General appearance - alert, well appearing, and in no distress Mental status - normal  mood, behavior, speech, dress, motor activity, and thought processes Chest - clear to auscultation, no wheezes, rales or rhonchi, symmetric air entry Heart - normal rate, regular rhythm, normal S1, S2, no murmurs, rubs, clicks or gallops Extremities - peripheral pulses normal, no pedal edema, no clubbing or cyanosis MSK:  no tenderness on palpation of the thoracic spine.   Skin: 1.5 cm soft moveable flat cyst in LT side mid thoracic paraspinal muscle.  Not tender to touch Diabetic Foot Exam - Simple   Simple Foot Form Diabetic Foot exam was performed with the following findings: Yes 05/15/2023 12:43 PM  Visual Inspection See comments: Yes Sensation Testing Intact to touch and monofilament testing bilaterally: Yes Pulse Check Posterior Tibialis and Dorsalis pulse intact bilaterally: Yes Comments Noninflamed bunion of the right foot.  Slightly flat-footed.        Latest Ref Rng & Units 10/13/2022    8:59 AM 05/30/2022   12:05 PM 11/08/2021    1:40 PM  CMP  Glucose 70 - 99 mg/dL 960   97   BUN 6 - 24 mg/dL 18   14   Creatinine 4.54 - 1.27 mg/dL 0.98   1.19   Sodium 147 - 144 mmol/L 142   144   Potassium 3.5 - 5.2 mmol/L 4.5   4.4   Chloride 96 - 106 mmol/L 103   105   CO2 20 - 29 mmol/L 22   24   Calcium 8.7 - 10.2 mg/dL 9.2   8.9   Total Protein 6.0 - 8.5 g/dL 6.8  7.0  7.2   Total Bilirubin 0.0 - 1.2 mg/dL 0.4  0.9  0.7   Alkaline Phos 44 - 121 IU/L 85  93  87   AST 0 - 40  IU/L 19  29  28    ALT 0 - 44 IU/L 26  44  38    Lipid Panel     Component Value Date/Time   CHOL 165 10/13/2022 0859   TRIG 156 (H) 10/13/2022 0859   HDL 39 (L) 10/13/2022 0859   CHOLHDL 4.2 10/13/2022 0859   CHOLHDL 4.5 06/19/2014 1223   VLDL 56 (H) 06/19/2014 1223   LDLCALC 99 10/13/2022 0859    CBC    Component Value Date/Time   WBC 5.8 11/08/2021 1340   WBC 6.8 12/06/2019 2241   RBC 5.06 11/08/2021 1340   RBC 4.93 12/06/2019 2241   HGB 14.5 11/08/2021 1340   HCT 43.6 11/08/2021 1340   PLT 197 11/08/2021 1340   MCV 86 11/08/2021 1340   MCH 28.7 11/08/2021 1340   MCH 29.4 12/06/2019 2241   MCHC 33.3 11/08/2021 1340   MCHC 33.7 12/06/2019 2241   RDW 13.2 11/08/2021 1340   LYMPHSABS 2.0 07/13/2020 1207   MONOABS 0.7 06/30/2016 1829   EOSABS 0.2 07/13/2020 1207   BASOSABS 0.1 07/13/2020 1207    ASSESSMENT AND PLAN: 1. Type 2 diabetes mellitus with obesity (HCC) (Primary) Diet control.  Encouraged him to continue healthy eating habits Encouraged him to get his eye exam done and send me a copy of the date and where he had the exam done so that we can update the health maintenance. - POCT glycosylated hemoglobin (Hb A1C) - POCT glucose (manual entry)  2. Hyperlipidemia associated with type 2 diabetes mellitus (HCC) Refill sent on atorvastatin.  After being on the medication for 1 month, he should return to the lab for chemistry and lipid profile checked. - atorvastatin (LIPITOR) 10 MG tablet; Take 1  tablet (10 mg total) by mouth daily.  Dispense: 30 tablet; Refill: 4 - Lipid panel; Future - Comprehensive metabolic panel; Future  3. Cyst, subcutaneous of the trunk This is small in size.  I recommend we observe for now.  Advised patient if it starts increasing in size or becomes painful we will refer to dermatology or general surgeon to have it removed.  4. Numbness Occurs between the shoulder blade when he stretches his trunk.  I recommend we either observe for now or  we can put him on a low-dose of gabapentin.  Patient never really made a decision on this so we will observe for now  5. Screening for colon cancer - Fecal occult blood, imunochemical([facility])  6. Influenza vaccination declined    Patient was given the opportunity to ask questions.  Patient verbalized understanding of the plan and was able to repeat key elements of the plan.   This documentation was completed using Paediatric nurse.  Any transcriptional errors are unintentional.  Orders Placed This Encounter  Procedures   Fecal occult blood, imunochemical([facility])   Lipid panel   Comprehensive metabolic panel   POCT glycosylated hemoglobin (Hb A1C)   POCT glucose (manual entry)     Requested Prescriptions   Signed Prescriptions Disp Refills   atorvastatin (LIPITOR) 10 MG tablet 30 tablet 4    Sig: Take 1 tablet (10 mg total) by mouth daily.    Return in about 5 months (around 10/13/2023) for Give lab appt for 1 mth.  Jonah Blue, MD, FACP

## 2023-06-19 ENCOUNTER — Ambulatory Visit: Payer: Self-pay | Attending: Family Medicine

## 2023-06-19 DIAGNOSIS — E1169 Type 2 diabetes mellitus with other specified complication: Secondary | ICD-10-CM

## 2023-06-20 LAB — COMPREHENSIVE METABOLIC PANEL
ALT: 36 [IU]/L (ref 0–44)
AST: 26 [IU]/L (ref 0–40)
Albumin: 4.7 g/dL (ref 4.1–5.1)
Alkaline Phosphatase: 82 [IU]/L (ref 44–121)
BUN/Creatinine Ratio: 18 (ref 9–20)
BUN: 15 mg/dL (ref 6–24)
Bilirubin Total: 0.4 mg/dL (ref 0.0–1.2)
CO2: 23 mmol/L (ref 20–29)
Calcium: 9.4 mg/dL (ref 8.7–10.2)
Chloride: 103 mmol/L (ref 96–106)
Creatinine, Ser: 0.83 mg/dL (ref 0.76–1.27)
Globulin, Total: 2 g/dL (ref 1.5–4.5)
Glucose: 91 mg/dL (ref 70–99)
Potassium: 3.7 mmol/L (ref 3.5–5.2)
Sodium: 143 mmol/L (ref 134–144)
Total Protein: 6.7 g/dL (ref 6.0–8.5)
eGFR: 107 mL/min/{1.73_m2} (ref >59)

## 2023-06-20 LAB — LIPID PANEL
Chol/HDL Ratio: 4.3 {ratio} (ref 0.0–5.0)
Cholesterol, Total: 141 mg/dL (ref 100–199)
HDL: 33 mg/dL — ABNORMAL LOW (ref >39)
LDL Chol Calc (NIH): 60 mg/dL (ref 0–99)
Triglycerides: 304 mg/dL — ABNORMAL HIGH (ref 0–149)
VLDL Cholesterol Cal: 48 mg/dL — ABNORMAL HIGH (ref 5–40)

## 2023-06-23 ENCOUNTER — Other Ambulatory Visit: Payer: Self-pay

## 2023-06-24 ENCOUNTER — Other Ambulatory Visit: Payer: Self-pay

## 2023-06-24 ENCOUNTER — Other Ambulatory Visit (HOSPITAL_COMMUNITY): Payer: Self-pay

## 2023-08-24 ENCOUNTER — Other Ambulatory Visit: Payer: Self-pay

## 2023-10-16 ENCOUNTER — Other Ambulatory Visit: Payer: Self-pay

## 2023-10-16 ENCOUNTER — Ambulatory Visit: Payer: Self-pay | Attending: Internal Medicine | Admitting: Internal Medicine

## 2023-10-16 ENCOUNTER — Encounter: Payer: Self-pay | Admitting: Internal Medicine

## 2023-10-16 VITALS — BP 118/70 | HR 52 | Temp 98.1°F | Ht 67.0 in | Wt 203.0 lb

## 2023-10-16 DIAGNOSIS — F5101 Primary insomnia: Secondary | ICD-10-CM

## 2023-10-16 DIAGNOSIS — Z2821 Immunization not carried out because of patient refusal: Secondary | ICD-10-CM

## 2023-10-16 DIAGNOSIS — Z6831 Body mass index (BMI) 31.0-31.9, adult: Secondary | ICD-10-CM

## 2023-10-16 DIAGNOSIS — Z1211 Encounter for screening for malignant neoplasm of colon: Secondary | ICD-10-CM

## 2023-10-16 DIAGNOSIS — Z7984 Long term (current) use of oral hypoglycemic drugs: Secondary | ICD-10-CM

## 2023-10-16 DIAGNOSIS — L729 Follicular cyst of the skin and subcutaneous tissue, unspecified: Secondary | ICD-10-CM

## 2023-10-16 DIAGNOSIS — E785 Hyperlipidemia, unspecified: Secondary | ICD-10-CM

## 2023-10-16 DIAGNOSIS — E669 Obesity, unspecified: Secondary | ICD-10-CM

## 2023-10-16 DIAGNOSIS — E119 Type 2 diabetes mellitus without complications: Secondary | ICD-10-CM

## 2023-10-16 DIAGNOSIS — E1169 Type 2 diabetes mellitus with other specified complication: Secondary | ICD-10-CM

## 2023-10-16 LAB — POCT GLYCOSYLATED HEMOGLOBIN (HGB A1C): HbA1c, POC (controlled diabetic range): 6 % (ref 0.0–7.0)

## 2023-10-16 LAB — GLUCOSE, POCT (MANUAL RESULT ENTRY): POC Glucose: 134 mg/dL — AB (ref 70–99)

## 2023-10-16 MED ORDER — METFORMIN HCL 500 MG PO TABS
500.0000 mg | ORAL_TABLET | Freq: Every day | ORAL | 3 refills | Status: AC
  Filled 2023-10-16: qty 90, 90d supply, fill #0

## 2023-10-16 NOTE — Patient Instructions (Signed)
 Insomnio Insomnia El insomnio es un trastorno del sueo que causa dificultades para conciliar el sueo o para [place]. Puede producir fatiga, falta de energa, dificultad para concentrarse, cambios en el estado de nimo y mal rendimiento escolar o laboral. Hay tres formas diferentes de clasificar el insomnio: Dificultad para conciliar el sueo. Dificultad para mantener el sueo. Despertar muy precoz por la maana. Cualquier tipo de insomnio puede ser a Air cabin crew (crnico) o a Product manager (agudo). Ambos son frecuentes. Generalmente, el insomnio a corto plazo dura 3 meses o menos tiempo. El insomnio crnico ocurre al menos tres veces por semana durante ms de 3 meses. Cules son las causas? El insomnio puede deberse a otra afeccin, situacin o sustancia, por ejemplo: Tener ciertas afecciones de salud mental, como ansiedad y depresin. Consumir cafena, alcohol, tabaco o drogas. Tener afecciones gastrointestinales, como enfermedad de reflujo gastroesofgico (ERGE). Tener ciertas afecciones. Estas incluyen las siguientes: Asma. Enfermedad de Alzheimer. Accidente cerebrovascular. Dolor crnico. Hiperactividad de la glndula tiroidea (hipertiroidismo). Otros trastornos del sueo, como sndrome de las piernas inquietas y apnea del sueo. Menopausia. En ocasiones, la causa del insomnio puede ser desconocida. Qu incrementa el riesgo? Los factores de riesgo de tener insomnio incluyen lo siguiente: Sexo. Esta afeccin es ms frecuente en las mujeres que en los hombres. Edad. El insomnio es ms frecuente a medida que las personas envejecen. El estrs y ciertas afecciones de salud mental y mdicas. Falta de actividad fsica. Tener un horario de trabajo irregular. Esto puede incluir trabajar turnos nocturnos y Tourist information centre manager entre diferentes zonas horarias. Cules son los signos o sntomas? Si tiene insomnio, el sntoma principal es la dificultad para conciliar el sueo o mantenerlo. Esto puede  derivar en otros sntomas, por ejemplo: Sentirse cansado o tener poca energa. Ponerse nervioso por [employer] irse a dormir. No sentirse descansado por la maana. Tener dificultad para concentrarse. Sentirse irritable, ansioso o deprimido. Cmo se diagnostica? Esta afeccin se puede diagnosticar en funcin de lo siguiente: Los sntomas y antecedentes mdicos. El mdico puede hacerle preguntas sobre: Hbitos de sueo. Cualquier afeccin mdica que tenga. La salud mental. Un examen fsico. Cmo se trata? El tratamiento para el insomnio depende de la causa. El tratamiento puede centrarse en tratar una afeccin subyacente que causa el insomnio. El tratamiento tambin puede incluir lo siguiente: Medicamentos que lo ayuden a dormir. Asesoramiento psicolgico o terapia. Ajustes en el estilo de vida para ayudarlo a dormir mejor. Siga estas indicaciones en su casa: Comida y bebida  Limite o evite el consumo de alcohol, bebidas con cafena y productos que contengan nicotina y tabaco, especialmente cerca de la hora de [place]. Estos productos pueden perturbarle el sueo. No consuma una comida suculenta ni coma alimentos condimentados justo antes de la hora de [place]. Esto puede causarle molestias digestivas y dificultades para dormir. Hbitos de sueo  Lleve un registro del sueo ya que podra ser de utilidad para que usted y a su mdico puedan determinar qu podra estar causndole insomnio. Escriba los siguientes datos: Cundo duerme. Cundo se despierta durante la noche. Qu tan bien duerme y si se siente descansado al da siguiente. Cualquier efecto secundario de los medicamentos que toma. Lo que usted come y bebe. Convierta su habitacin en un lugar oscuro, cmodo donde sea fcil conciliar el sueo. Coloque persianas o cortinas oscuras que impidan la entrada de la luz del exterior. Para bloquear los ruidos, use un aparato que reproduzca sonidos ambientales o relajantes de  fondo. Mantenga baja la temperatura. Limite el uso de  pantallas antes de la hora de [place]. Esto incluye: No ver televisin. No usar el telfono inteligente, la tableta o la computadora. Siga una rutina que incluya ir a dormir y Chiropodist a la misma hora cada da y noche. Esto puede ayudarlo a conciliar el sueo ms rpidamente. Considere realizar [employer] tranquila, como leer, e incorporarla como parte de la rutina a la hora de irse a dormir. Trate de evitar tomar siestas durante el da para que pueda dormir mejor por la noche. Levntese de la cama si sigue despierto despus de 15 minutos de haber intentado dormirse. Mantenga bajas las luces, pero intente leer o hacer una actividad tranquila. Cuando tenga sueo, regrese a Pharmacist, hospital. Indicaciones generales Use los medicamentos de venta libre y los recetados solamente como se lo haya indicado el mdico. Haga actividad fsica habitualmente como se lo haya indicado el mdico. Sin embargo, evite hacer actividad fsica las horas antes de [place]. Utilice tcnicas de relajacin para controlar el estrs. Pdale al mdico que le sugiera algunas tcnicas que sean adecuadas para usted. Pueden incluir: Ejercicios de respiracin. Rutinas para aliviar la tensin muscular. Visualizacin de escenas apacibles. Conduzca con cuidado. No conduzca si se siente muy somnoliento. Concurra a todas las visitas de seguimiento. Esto es importante. Comunquese con un mdico si: Est cansado durante todo Medical laboratory scientific officer. Tiene dificultad en su rutina diaria debido a la somnolencia. Sigue teniendo problemas para dormir o Teacher, English as a foreign language. Solicite ayuda de inmediato si: Piensa en lastimarse a usted mismo o a Engineer, maintenance (IT). Busque ayuda de inmediato si alguna vez siente que puede hacerse dao a usted mismo o a otros, o tiene pensamientos de Patent examiner a su vida. Dirjase al centro de urgencias ms cercano o: Llame al 911. Llame a National Suicide Prevention Lifeline (Lnea  Telefnica Nacional para la Prevencin del Suicidio) al [phone] o al 988. Est disponible las 24 horas del da. Enve un mensaje de texto a la lnea para casos de crisis al [phone]. Resumen El insomnio es un trastorno del sueo que causa dificultades para conciliar el sueo o para [place]. El insomnio puede ser a Air cabin crew (crnico) o a Product manager (agudo). El tratamiento para el insomnio depende de la causa. El tratamiento puede centrarse en tratar una afeccin subyacente que causa el insomnio. Lleve un registro del sueo ya que podra ser de utilidad para que usted y a su mdico puedan determinar qu podra estar causndole insomnio. Esta informacin no tiene Theme park manager el consejo del mdico. Asegrese de hacerle al mdico cualquier pregunta que tenga. Document Revised: 05/30/2021 Document Reviewed: 05/30/2021 Elsevier Patient Education  2024 [employer].

## 2023-10-16 NOTE — Progress Notes (Signed)
 Patient ID: Fred Mullins, male    DOB: July 02, 1975  MRN: [id number]  CC: Diabetes (DM f/u. /Metformin  not on med list - needs refill. /No to pneumonia vax)   Subjective: Fred Mullins is a 48 y.o. male who presents for chronic ds management. His concerns today include:  Patient with history of EtOH use disorder (quit 06/2021), abnormal LFTs, hyperTG, DM type 2, former smoker   AMN Language interpreter used during this encounter. #Fred Mullins [id number]  C/o problems sleeping x 4 mths.  No sure what may be contributing; "the diabetes I guess." Takes 2-3 hrs to fall asleep. -gets in bed around 1 a.m; no caff beverages or ETOH use in the evenings -watches TV for about 3 hrs before turning it off -once asleep, sleeps until he has to get up at 7 a.m  -denies excessive worrying or feelings of depression.    DM:  Results for orders placed or performed in visit on 10/16/23  POCT glycosylated hemoglobin (Hb A1C)   Collection Time: 10/16/23  9:01 AM  Result Value Ref Range   Hemoglobin A1C     HbA1c POC (<> result, manual entry)     HbA1c, POC (prediabetic range)     HbA1c, POC (controlled diabetic range) 6.0 0.0 - 7.0 %  POCT glucose (manual entry)   Collection Time: 10/16/23  9:01 AM  Result Value Ref Range   POC Glucose 134 (A) 70 - 99 mg/dl  has been diet control Checks BS occasionally.  2 days ago it was 240. He thinks he needs to get back on Metformin  Reports doing okay with eating habits.  Drinks sodas sometimes; eating more tortias Exercise: not much exercise outside of work.  Tired post work; workings in Aeronautical engineer Over due for DM eye exam; no insurance HL: taking and tolerating Lipitor 10 mg.  Last LDL was 60  On last visit, he reported having a bulge on the left side of the mid back x 1 year that goes and comes.  Not painful and has not increased in size since our last visit.  On last visit it was 1.5 cm in size and was assessed to be a subcutaneous cyst.  I had recommended  observation.  He wanted me to recheck it today.  HM: declined Prevnar 20. Turned in FIT today   Patient Active Problem List   Diagnosis Date Noted   Alcohol use disorder in remission 11/08/2021   New onset type 2 diabetes mellitus (HCC) 08/31/2020   Abnormal LFTs 08/31/2020   Rectal bleeding 08/31/2020   Influenza vaccine needed 07/13/2020   Obesity (BMI 30.0-34.9) 07/13/2020   Elevated blood pressure reading without diagnosis of hypertension 07/13/2020   Family history of diabetes mellitus 11/04/2013   Hypertriglyceridemia 11/04/2013   Vitamin D  deficiency 11/04/2013   Hemorrhoid 11/04/2013   Constipation 11/04/2013   Dental caries 12/16/2012   Hypersomnia with sleep apnea 12/15/2012   Allergic rhinitis 12/15/2012   Chronic headaches 12/15/2012     Current Outpatient Medications on File Prior to Visit  Medication Sig Dispense Refill   atorvastatin  (LIPITOR) 10 MG tablet Take 1 tablet (10 mg total) by mouth daily. 30 tablet 4   Blood Glucose Monitoring Suppl (TRUE METRIX METER) w/Device KIT Use as instructed to check blood sugar once daily. 1 kit 0   glucose blood (TRUE METRIX BLOOD GLUCOSE TEST) test strip Use as instructed to check blood sugar once daily. 100 each 2   TRUEplus Lancets 28G MISC Use to instructed to check  blood sugar once daily. 100 each 2   No current facility-administered medications on file prior to visit.    Allergies  Allergen Reactions   Pollen Extract Other (See Comments)    Unknown    Social History   Socioeconomic History   Marital status: Single    Spouse name: Not on file   Number of children: 4   Years of education: Not on file   Highest education level: Not on file  Occupational History   Not on file  Tobacco Use   Smoking status: Former    Current packs/day: 0.00    Types: Cigarettes    Quit date: 2022    Years since quitting: 3.3   Smokeless tobacco: Never  Vaping Use   Vaping status: Never Used  Substance and Sexual Activity    Alcohol use: Yes    Comment: occasonally   Drug use: No   Sexual activity: Not on file  Other Topics Concern   Not on file  Social History Narrative   Not on file   Social Drivers of Health   Financial Resource Strain: Low Risk  (10/16/2023)   Overall Financial Resource Strain (CARDIA)    Difficulty of Paying Living Expenses: Not very hard  Food Insecurity: No Food Insecurity (10/16/2023)   Hunger Vital Sign    Worried About Running Out of Food in the Last Year: Never true    Ran Out of Food in the Last Year: Never true  Transportation Needs: No Transportation Needs (10/16/2023)   PRAPARE - Administrator, Civil Service (Medical): No    Lack of Transportation (Non-Medical): No  Physical Activity: Inactive (10/16/2023)   Exercise Vital Sign    Days of Exercise per Week: 0 days    Minutes of Exercise per Session: 0 min  Stress: No Stress Concern Present (10/16/2023)   [employer] of Occupational Health - Occupational Stress Questionnaire    Feeling of Stress : Not at all  Social Connections: Moderately Integrated (10/16/2023)   Social Connection and Isolation Panel [NHANES]    Frequency of Communication with Friends and Family: Once a week    Frequency of Social Gatherings with Friends and Family: Never    Attends Religious Services: More than 4 times per year    Active Member of [employer] or Organizations: No    Attends Engineer, structural: More than 4 times per year    Marital Status: Married  Catering manager Violence: Not At Risk (10/16/2023)   Humiliation, Afraid, Rape, and Kick questionnaire    Fear of Current or Ex-Partner: No    Emotionally Abused: No    Physically Abused: No    Sexually Abused: No    Family History  Problem Relation Age of Onset   Hyperlipidemia Mother    Diabetes Father     Past Surgical History:  Procedure Laterality Date   FOOT SURGERY     cyst removed from foot as a child   ORIF ANKLE FRACTURE Left 10/26/2015    Procedure: OPEN REDUCTION INTERNAL FIXATION (ORIF) LEFT FIBULA FRACTURE;  Surgeon: Timothy Ford, MD;  Location: [facility] OR;  Service: Orthopedics;  Laterality: Left;   ORIF WRIST FRACTURE Left 12/12/2019   Procedure: OPEN TREATMENT OF LEFT FOREARM FRACTURE;  Surgeon: Rober Chimera, MD;  Location: [facility];  Service: Orthopedics;  Laterality: Left;  REGIONAL/MAC    ROS: Review of Systems Negative except as stated above  PHYSICAL EXAM: BP 118/70 (BP Location: Left Arm,  Patient Position: Sitting, Cuff Size: Normal)   Pulse (!) 52   Temp 98.1 F (36.7 C) (Oral)   Ht 5\' 7"  (1.702 m)   Wt 203 lb (92.1 kg)   SpO2 95%   BMI 31.79 kg/m   Physical Exam General appearance - alert, well appearing, middle-age Hispanic male and in no distress Mental status - normal mood, behavior, speech, dress, motor activity, and thought processes Neck - supple, no significant adenopathy Chest - clear to auscultation, no wheezes, rales or rhonchi, symmetric air entry Heart - normal rate, regular rhythm, normal S1, S2, no murmurs, rubs, clicks or gallops Extremities - peripheral pulses normal, no pedal edema, no clubbing or cyanosis Skin: 1.5 cm soft moveable flat cyst in LT side mid thoracic paraspinal muscle. Not tender to touch  Diabetic Foot Exam - Simple   Simple Foot Form Diabetic Foot exam was performed with the following findings: Yes 10/16/2023  9:07 AM  Visual Inspection See comments: Yes Sensation Testing Intact to touch and monofilament testing bilaterally: Yes Pulse Check Posterior Tibialis and Dorsalis pulse intact bilaterally: Yes Comments Toenails are chipped and discolored.        Latest Ref Rng & Units 06/19/2023    9:01 AM 10/13/2022    8:59 AM 05/30/2022   12:05 PM  CMP  Glucose 70 - 99 mg/dL 91  161    BUN 6 - 24 mg/dL 15  18    Creatinine 0.96 - 1.27 mg/dL 0.45  4.09    Sodium 811 - 144 mmol/L 143  142    Potassium 3.5 - 5.2 mmol/L 3.7  4.5    Chloride 96 - 106  mmol/L 103  103    CO2 20 - 29 mmol/L 23  22    Calcium  8.7 - 10.2 mg/dL 9.4  9.2    Total Protein 6.0 - 8.5 g/dL 6.7  6.8  7.0   Total Bilirubin 0.0 - 1.2 mg/dL 0.4  0.4  0.9   Alkaline Phos 44 - 121 IU/L 82  85  93   AST 0 - 40 IU/L 26  19  29    ALT 0 - 44 IU/L 36  26  44    Lipid Panel     Component Value Date/Time   CHOL 141 06/19/2023 0901   TRIG 304 (H) 06/19/2023 0901   HDL 33 (L) 06/19/2023 0901   CHOLHDL 4.3 06/19/2023 0901   CHOLHDL 4.5 06/19/2014 1223   VLDL 56 (H) 06/19/2014 1223   LDLCALC 60 06/19/2023 0901    CBC    Component Value Date/Time   WBC 5.8 11/08/2021 1340   WBC 6.8 12/06/2019 2241   RBC 5.06 11/08/2021 1340   RBC 4.93 12/06/2019 2241   HGB 14.5 11/08/2021 1340   HCT 43.6 11/08/2021 1340   PLT 197 11/08/2021 1340   MCV 86 11/08/2021 1340   MCH 28.7 11/08/2021 1340   MCH 29.4 12/06/2019 2241   MCHC 33.3 11/08/2021 1340   MCHC 33.7 12/06/2019 2241   RDW 13.2 11/08/2021 1340   LYMPHSABS 2.0 07/13/2020 1207   MONOABS 0.7 06/30/2016 1829   EOSABS 0.2 07/13/2020 1207   BASOSABS 0.1 07/13/2020 1207    ASSESSMENT AND PLAN: 1. Type 2 diabetes mellitus with obesity (HCC) (Primary) A1c is at goal.  Encourage patient to check blood sugars more regularly at least 4 times a week.  We will restart him on low-dose metformin  of 500 mg once a week.  Encouraged him to cut back on the  white carbs and to eliminate the sugary drinks from the diet. - POCT glycosylated hemoglobin (Hb A1C) - POCT glucose (manual entry) - metFORMIN  (GLUCOPHAGE ) 500 MG tablet; Take 1 tablet (500 mg total) by mouth daily with breakfast.  Dispense: 90 tablet; Refill: 3  2. Primary insomnia Good sleep hygiene discussed and encouraged. Patient advised not to drink any caffeinated beverages or excessive alcohol use within several hours of bedtime.  Advised to get in bed around about the same time every night.  Once in bed, turn off all lights and sounds.  If unable to fall asleep within  30 to 45 minutes of getting in bed, patient should get up and try to do something until he feels sleepy again.  At that time try getting back in bed. Advised that he can try melatonin 3 mg daily at bedtime.   3. Hyperlipidemia associated with type 2 diabetes mellitus (HCC) Continue atorvastatin  10 mg daily.  Last LDL was 60 which is at goal.  4. Subcutaneous cyst This is unchanged in size from previous visit and is asymptomatic.  We will continue to monitor.  5. Screening for colon cancer  turned in his FIT test today  6. Pneumococcal vaccination declined  Patient was given the opportunity to ask questions.  Patient verbalized understanding of the plan and was able to repeat key elements of the plan.   This documentation was completed using Paediatric nurse.  Any transcriptional errors are unintentional.  Orders Placed This Encounter  Procedures   POCT glycosylated hemoglobin (Hb A1C)   POCT glucose (manual entry)     Requested Prescriptions   Signed Prescriptions Disp Refills   metFORMIN  (GLUCOPHAGE ) 500 MG tablet 90 tablet 3    Sig: Take 1 tablet (500 mg total) by mouth daily with breakfast.    Return in about 4 months (around 02/16/2024).  Concetta Dee, MD, FACP

## 2023-10-17 LAB — FECAL OCCULT BLOOD, IMMUNOCHEMICAL: Fecal Occult Bld: NEGATIVE

## 2023-10-18 ENCOUNTER — Ambulatory Visit: Payer: Self-pay | Admitting: Internal Medicine

## 2023-10-20 ENCOUNTER — Ambulatory Visit: Payer: Self-pay

## 2023-10-20 NOTE — Telephone Encounter (Signed)
 Noted

## 2023-10-20 NOTE — Telephone Encounter (Signed)
 Copied from CRM [id number]. Topic: General - Other >> Oct 20, 2023  3:34 PM Kevelyn M wrote: Reason for CRM: Patient's wife calling in for test results   Patient and his wife called stating they missed a call regarding his lab results. I have reviewed the lab results with the patient and his wife. They have no further questions at this time.    Reason for Disposition  Caller requesting lab results  (Exception: Routine or non-urgent lab result.)  Answer Assessment - Initial Assessment Questions 1. REASON FOR CALL or QUESTION: "What is your reason for calling today?" or "How can I best help you?" or "What question do you have that I can help answer?"     Lab results  2. CALLER: Document the source of call. (e.g., laboratory, patient).     Patient and wife  Protocols used: PCP Call - No Triage-A-AH

## 2024-01-30 ENCOUNTER — Other Ambulatory Visit: Payer: Self-pay | Admitting: Internal Medicine

## 2024-01-30 DIAGNOSIS — E1169 Type 2 diabetes mellitus with other specified complication: Secondary | ICD-10-CM

## 2024-02-01 MED ORDER — ATORVASTATIN CALCIUM 10 MG PO TABS
10.0000 mg | ORAL_TABLET | Freq: Every day | ORAL | 2 refills | Status: AC
Start: 2024-02-01 — End: ?
  Filled 2024-02-01: qty 30, 30d supply, fill #0

## 2024-02-02 ENCOUNTER — Other Ambulatory Visit: Payer: Self-pay

## 2024-02-08 ENCOUNTER — Other Ambulatory Visit: Payer: Self-pay

## 2024-02-09 ENCOUNTER — Other Ambulatory Visit: Payer: Self-pay

## 2024-02-09 ENCOUNTER — Other Ambulatory Visit: Payer: Self-pay | Admitting: Internal Medicine

## 2024-02-09 DIAGNOSIS — K5900 Constipation, unspecified: Secondary | ICD-10-CM

## 2024-02-10 NOTE — Telephone Encounter (Signed)
 Requested medication (s) are due for refill today:   Provider to review  Requested medication (s) are on the active medication list:   Yes from 10/24/2015 by a Dr. Deepak Advani  Future visit scheduled:   Yes 02/16/2024     LOV 10/16/2023 with Dr. Vicci   Last ordered: 10/24/2015 by another provider  Unable to refill because this rx from 2017 and another provider.      Requested Prescriptions  Pending Prescriptions Disp Refills   polyethylene glycol powder (GLYCOLAX /MIRALAX ) 17 GM/SCOOP powder 3350 g 1    Sig: Take 17 g by mouth 2 (two) times daily as needed.     Gastroenterology:  Laxatives Passed - 02/10/2024  1:40 PM      Passed - Valid encounter within last 12 months    Recent Outpatient Visits           3 months ago Type 2 diabetes mellitus with obesity (HCC)   [facility] [place] Vicci Sober B, MD   9 months ago Type 2 diabetes mellitus with obesity [facility])   [facility] [place]. [facility] Vicci Sober NOVAK, MD   1 year ago Type 2 diabetes mellitus with obesity [facility])   [facility] [place]. [facility] Vicci Sober NOVAK, MD   1 year ago Type 2 diabetes mellitus with obesity [facility])   [facility] [place]. [facility] Vicci Sober NOVAK, MD   1 year ago Diabetes mellitus type 2 in obese [facility])   [facility] [place]. [facility] Vicci Sober NOVAK, MD

## 2024-02-15 ENCOUNTER — Other Ambulatory Visit: Payer: Self-pay

## 2024-02-16 ENCOUNTER — Ambulatory Visit: Payer: Self-pay | Admitting: Internal Medicine

## 2024-04-27 ENCOUNTER — Emergency Department (HOSPITAL_COMMUNITY): Payer: Self-pay

## 2024-04-27 ENCOUNTER — Encounter (HOSPITAL_COMMUNITY): Payer: Self-pay

## 2024-04-27 ENCOUNTER — Other Ambulatory Visit: Payer: Self-pay

## 2024-04-27 ENCOUNTER — Emergency Department (HOSPITAL_COMMUNITY)
Admission: EM | Admit: 2024-04-27 | Discharge: 2024-04-27 | Disposition: A | Discharge status: Home / Self Care | Payer: Self-pay | Attending: Emergency Medicine | Admitting: Emergency Medicine

## 2024-04-27 DIAGNOSIS — S92421A Displaced fracture of distal phalanx of right great toe, initial encounter for closed fracture: Secondary | ICD-10-CM | POA: Insufficient documentation

## 2024-04-27 DIAGNOSIS — M25571 Pain in right ankle and joints of right foot: Secondary | ICD-10-CM | POA: Insufficient documentation

## 2024-04-27 DIAGNOSIS — Y92019 Unspecified place in single-family (private) house as the place of occurrence of the external cause: Secondary | ICD-10-CM | POA: Insufficient documentation

## 2024-04-27 DIAGNOSIS — Z79899 Other long term (current) drug therapy: Secondary | ICD-10-CM | POA: Insufficient documentation

## 2024-04-27 DIAGNOSIS — W01198A Fall on same level from slipping, tripping and stumbling with subsequent striking against other object, initial encounter: Secondary | ICD-10-CM | POA: Insufficient documentation

## 2024-04-27 DIAGNOSIS — S0101XA Laceration without foreign body of scalp, initial encounter: Secondary | ICD-10-CM | POA: Insufficient documentation

## 2024-04-27 DIAGNOSIS — S0003XA Contusion of scalp, initial encounter: Secondary | ICD-10-CM

## 2024-04-27 DIAGNOSIS — S20219A Contusion of unspecified front wall of thorax, initial encounter: Secondary | ICD-10-CM | POA: Insufficient documentation

## 2024-04-27 DIAGNOSIS — W19XXXA Unspecified fall, initial encounter: Secondary | ICD-10-CM

## 2024-04-27 DIAGNOSIS — Z7984 Long term (current) use of oral hypoglycemic drugs: Secondary | ICD-10-CM | POA: Insufficient documentation

## 2024-04-27 DIAGNOSIS — S22080A Wedge compression fracture of T11-T12 vertebra, initial encounter for closed fracture: Secondary | ICD-10-CM | POA: Insufficient documentation

## 2024-04-27 DIAGNOSIS — M546 Pain in thoracic spine: Secondary | ICD-10-CM | POA: Insufficient documentation

## 2024-04-27 DIAGNOSIS — Y990 Civilian activity done for income or pay: Secondary | ICD-10-CM | POA: Insufficient documentation

## 2024-04-27 LAB — CBC
HCT: 42.5 % (ref 39.0–52.0)
Hemoglobin: 14.4 g/dL (ref 13.0–17.0)
MCH: 29.7 pg (ref 26.0–34.0)
MCHC: 33.9 g/dL (ref 30.0–36.0)
MCV: 87.6 fL (ref 80.0–100.0)
Platelets: 174 K/uL (ref 150–400)
RBC: 4.85 MIL/uL (ref 4.22–5.81)
RDW: 12.3 % (ref 11.5–15.5)
WBC: 10.4 K/uL (ref 4.0–10.5)
nRBC: 0 % (ref 0.0–0.2)

## 2024-04-27 LAB — COMPREHENSIVE METABOLIC PANEL WITH GFR
ALT: 36 U/L (ref 0–44)
AST: 36 U/L (ref 15–41)
Albumin: 4.2 g/dL (ref 3.5–5.0)
Alkaline Phosphatase: 63 U/L (ref 38–126)
Anion gap: 14 (ref 5–15)
BUN: 16 mg/dL (ref 6–20)
CO2: 25 mmol/L (ref 22–32)
Calcium: 9 mg/dL (ref 8.9–10.3)
Chloride: 102 mmol/L (ref 98–111)
Creatinine, Ser: 0.96 mg/dL (ref 0.61–1.24)
GFR, Estimated: >60 mL/min (ref >60)
Glucose, Bld: 136 mg/dL — ABNORMAL HIGH (ref 70–99)
Potassium: 3.8 mmol/L (ref 3.5–5.1)
Sodium: 141 mmol/L (ref 135–145)
Total Bilirubin: 1.1 mg/dL (ref 0.0–1.2)
Total Protein: 6.8 g/dL (ref 6.5–8.1)

## 2024-04-27 LAB — I-STAT CHEM 8, ED
BUN: 18 mg/dL (ref 6–20)
Calcium, Ion: 1.13 mmol/L — ABNORMAL LOW (ref 1.15–1.40)
Chloride: 103 mmol/L (ref 98–111)
Creatinine, Ser: 1.1 mg/dL (ref 0.61–1.24)
Glucose, Bld: 131 mg/dL — ABNORMAL HIGH (ref 70–99)
HCT: 42 % (ref 39.0–52.0)
Hemoglobin: 14.3 g/dL (ref 13.0–17.0)
Potassium: 3.7 mmol/L (ref 3.5–5.1)
Sodium: 143 mmol/L (ref 135–145)
TCO2: 26 mmol/L (ref 22–32)

## 2024-04-27 LAB — PROTIME-INR
INR: 1 (ref 0.8–1.2)
Prothrombin Time: 13.6 s (ref 11.4–15.2)

## 2024-04-27 LAB — SAMPLE TO BLOOD BANK

## 2024-04-27 LAB — CBG MONITORING, ED: Glucose-Capillary: 121 mg/dL — ABNORMAL HIGH (ref 70–99)

## 2024-04-27 LAB — ETHANOL: Alcohol, Ethyl (B): <15 mg/dL (ref <15)

## 2024-04-27 LAB — I-STAT CG4 LACTIC ACID, ED: Lactic Acid, Venous: 1.8 mmol/L (ref 0.5–1.9)

## 2024-04-27 MED ORDER — FENTANYL CITRATE (PF) 50 MCG/ML IJ SOSY
100.0000 ug | PREFILLED_SYRINGE | Freq: Once | INTRAMUSCULAR | Status: AC
Start: 2024-04-27 — End: 2024-04-27
  Administered 2024-04-27: 100 ug via INTRAVENOUS
  Filled 2024-04-27: qty 2

## 2024-04-27 MED ORDER — METHOCARBAMOL 500 MG PO TABS: 1000.0000 mg | ORAL_TABLET | Freq: Three times a day (TID) | ORAL | 0 refills | Status: AC | PRN

## 2024-04-27 MED ORDER — KETOROLAC TROMETHAMINE 15 MG/ML IJ SOLN
15.0000 mg | Freq: Once | INTRAMUSCULAR | Status: AC
Start: 2024-04-27 — End: 2024-04-27
  Administered 2024-04-27: 15 mg via INTRAVENOUS
  Filled 2024-04-27: qty 1

## 2024-04-27 MED ORDER — LIDOCAINE-EPINEPHRINE (PF) 2 %-1:200000 IJ SOLN
10.0000 mL | Freq: Once | INTRAMUSCULAR | Status: AC
  Administered 2024-04-27: 10 mL
  Filled 2024-04-27: qty 20

## 2024-04-27 MED ORDER — OXYCODONE HCL 5 MG PO TABS
5.0000 mg | ORAL_TABLET | Freq: Four times a day (QID) | ORAL | 0 refills | Status: AC | PRN
Start: 2024-04-27 — End: ?

## 2024-04-27 MED ORDER — ONDANSETRON HCL 4 MG/2ML IJ SOLN
4.0000 mg | Freq: Once | INTRAMUSCULAR | Status: AC
Start: 2024-04-27 — End: 2024-04-27
  Administered 2024-04-27: 4 mg via INTRAVENOUS
  Filled 2024-04-27: qty 2

## 2024-04-27 MED ORDER — OXYCODONE HCL 5 MG PO TABS: 5.0000 mg | ORAL_TABLET | Freq: Four times a day (QID) | ORAL | 0 refills | Status: DC | PRN

## 2024-04-27 MED ORDER — HYDROMORPHONE HCL 1 MG/ML IJ SOLN
0.5000 mg | Freq: Once | INTRAMUSCULAR | Status: AC
Start: 2024-04-27 — End: 2024-04-27
  Administered 2024-04-27: 0.5 mg via INTRAVENOUS
  Filled 2024-04-27: qty 1

## 2024-04-27 MED ORDER — OXYCODONE HCL 5 MG PO TABS
5.0000 mg | ORAL_TABLET | Freq: Once | ORAL | Status: AC
  Administered 2024-04-27: 5 mg via ORAL
  Filled 2024-04-27: qty 1

## 2024-04-27 MED ORDER — IOHEXOL 350 MG/ML SOLN
75.0000 mL | Freq: Once | INTRAVENOUS | Status: AC | PRN
  Administered 2024-04-27: 75 mL via INTRAVENOUS

## 2024-04-27 MED ORDER — NAPROXEN 500 MG PO TABS: 500.0000 mg | ORAL_TABLET | Freq: Two times a day (BID) | ORAL | 0 refills | Status: DC

## 2024-04-27 MED ORDER — METHOCARBAMOL 500 MG PO TABS: 1000.0000 mg | ORAL_TABLET | Freq: Three times a day (TID) | ORAL | 0 refills | Status: DC | PRN

## 2024-04-27 MED ORDER — NAPROXEN 500 MG PO TABS: 500.0000 mg | ORAL_TABLET | Freq: Two times a day (BID) | ORAL | 0 refills | Status: AC

## 2024-04-27 NOTE — Progress Notes (Signed)
   04/27/24 1728  Spiritual Encounters  Type of Visit Initial  Care provided to: Family  Referral source Chaplain assessment  Reason for visit Trauma  OnCall Visit Yes   Provided support to patient's family while in waiting area as patient was being attended to and not yet available. Several family members including spouse were present.

## 2024-04-27 NOTE — ED Notes (Signed)
 Pt provided with discharge and follow up instructions, medications discussed, pt verbalized understanding. LDA removed, VSS, pt ambulatory out of ED w/ all paperwork and belongings in NAD.

## 2024-04-27 NOTE — ED Provider Notes (Signed)
 [place] EMERGENCY DEPARTMENT AT [facility] Provider Note   CSN: [id number] Arrival date & time: 04/27/24  1714     Patient presents with: [name]   Fred Mullins is a 48 y.o. male.   Patient presents to the emergency department today after a fall.  Patient states that he was trying to remove wood at his house, fell approximately 10 feet.  Bystander reports loss of consciousness.  Patient does not member what happened.  No retrograde amnesia.  Patient currently complains of pain in the back of his head, pain in his mid chest and left chest, pain in his mid back, pain in the right big toe.  EMS placed c-collar.  Patient denies abdominal pain.  No vomiting.  No vision change or vision loss.       Prior to Admission medications   Medication Sig Start Date End Date Taking? Authorizing Provider  atorvastatin  (LIPITOR) 10 MG tablet Take 1 tablet (10 mg total) by mouth daily. 02/01/24   Vicci Barnie NOVAK, MD  Blood Glucose Monitoring Suppl (TRUE METRIX METER) w/Device KIT Use as instructed to check blood sugar once daily. 09/20/20   Vicci Barnie NOVAK, MD  glucose blood (TRUE METRIX BLOOD GLUCOSE TEST) test strip Use as instructed to check blood sugar once daily. 09/11/22   Vicci Barnie NOVAK, MD  metFORMIN  (GLUCOPHAGE ) 500 MG tablet Take 1 tablet (500 mg total) by mouth daily with breakfast. 10/16/23   Vicci Barnie NOVAK, MD  TRUEplus Lancets 28G MISC Use to instructed to check blood sugar once daily. 09/20/20   Vicci Barnie NOVAK, MD    Allergies: Pollen extract    Review of Systems  Updated Vital Signs BP 137/83 (BP Location: Right Arm)   Pulse 73   Resp (!) 21   Ht 5' 1.02 (1.55 m)   Wt 92.1 kg   SpO2 98%   BMI 38.33 kg/m   Physical Exam Vitals and nursing note reviewed.  Constitutional:      General: He is not in acute distress.    Appearance: He is well-developed.  HENT:     Head: Normocephalic.     Comments: Boggy hematoma palpated to the posterior scalp  without any significant active bleeding.    Right Ear: Tympanic membrane, ear canal and external ear normal.     Left Ear: Tympanic membrane, ear canal and external ear normal.     Nose: Nose normal.     Mouth/Throat:     Mouth: Mucous membranes are moist.     Comments: No trauma to the midface, no obvious dental injury, mandible full range of motion without signs of malocclusion. Eyes:     General:        Right eye: No discharge.        Left eye: No discharge.     Conjunctiva/sclera: Conjunctivae normal.  Neck:     Comments: Immobilized in c-collar Cardiovascular:     Rate and Rhythm: Normal rate and regular rhythm.     Heart sounds: Normal heart sounds.  Pulmonary:     Effort: Pulmonary effort is normal.     Breath sounds: Normal breath sounds.     Comments: Reports some tenderness to palpation over the mid chest to the left anterior chest, no deformities or ecchymosis noted. Abdominal:     Palpations: Abdomen is soft.     Tenderness: There is no abdominal tenderness. There is no guarding or rebound.     Comments: No tenderness to palpation  Genitourinary:  Comments: No tenderness or instability over the pelvis Musculoskeletal:     Right hip: No tenderness.     Left hip: No tenderness.     Comments: Patient with tenderness right great toe, no deformity.  2+ pedal pulses bilaterally.  Skin:    General: Skin is warm and dry.  Neurological:     Mental Status: He is alert.     (all labs ordered are listed, but only abnormal results are displayed) Labs Reviewed  COMPREHENSIVE METABOLIC PANEL WITH GFR - Abnormal; Notable for the following components:      Result Value   Glucose, Bld 136 (*)    All other components within normal limits  I-STAT CHEM 8, ED - Abnormal; Notable for the following components:   Glucose, Bld 131 (*)    Calcium , Ion 1.13 (*)    All other components within normal limits  CBG MONITORING, ED - Abnormal; Notable for the following components:    Glucose-Capillary 121 (*)    All other components within normal limits  CBC  ETHANOL  PROTIME-INR  URINALYSIS, ROUTINE W REFLEX MICROSCOPIC  I-STAT CG4 LACTIC ACID, ED  SAMPLE TO BLOOD BANK    EKG: None  Radiology: CT L-SPINE NO CHARGE Result Date: 04/27/2024 EXAM: CT THORACIC AND LUMBAR SPINE WITH INTRAVENOUS CONTRAST 04/27/2024 06:13:36 PM TECHNIQUE: CT of the thoracic and lumbar spine was performed with the administration of 75 mL of iohexol  (OMNIPAQUE ) 350 MG/ML injection. Multiplanar reformatted images are provided for review. Automated exposure control, iterative reconstruction, and/or weight based adjustment of the mA/kV was utilized to reduce the radiation dose to as low as reasonably achievable. Incidental adrenal and/or renal findings do not require follow up imaging. COMPARISON: Limited comparison is available with PA lateral chest 06/30/16. No prior cross-sectional imaging other than today's body CT. No prior lumbar spine films or imaging. CLINICAL HISTORY: FINDINGS: THORACIC SPINE: BONES AND ALIGNMENT: Normal alignment. Mild acute superior endplate anterior wedge compression fracture of the T11 vertebral body, without retropulsion, spinal canal hematoma or posterior element involvement. There is no appreciable posterior height loss. There is 20 percent loss of the anterior and central vertebral body height at this level. Slight wedging of the T12 vertebral body is noted but was also present on the prior PA and lateral chest and unchanged. Remaining thoracic vertebrae are normal in height. No focal pathologic bone lesion. No paravertebral hematoma or fluid collection. DEGENERATIVE CHANGES: Thoracic spondylosis without bulky or bridging osteophytes. The discs are mildly degenerated at most levels. There is a shallow left paracentral disc protrusion at T12-L1, mildly deforming the left ventral cord surface. Other levels do not show evidence of herniated discs or cord encroachment. The  thoracic facet joints are normally aligned, with mild spurring at the lower levels. Thoracic spine intervertebral foramina are patent with the exception of mild to moderate right foraminal stenosis at T7-T8, and bilateral moderate foraminal stenosis at T8-T9 and T9-T10. SOFT TISSUES: No acute abnormality. 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 The lumbar alignment is otherwise normal. No acute lumbar fracture is seen. Slight wedging of the L1 body, which was present on the prior PA and lateral chest. This is unchanged. The other lumbar vertebrae are normal in height. All pedicles and posterior elements are unremarkable. Hemangioma to the left in the L1 vertebral body. No focal pathologic bone lesion. DEGENERATIVE CHANGES: Mild disc space loss of L1-L2. The discs are normal in height below this.  Mild lumbar spondylosis. At L1-L2, there is a moderate right paracentral disc protrusion. This causes likely mass effect on the right L2 nerve root in the subarticular zone but is age indeterminate. At L2-L3, there is a mild diffuse nonstenosing disc bulge without herniation or stenosis. L3-L4 is unremarkable. . At L4-L5 there is a small right paracentral disc protrusion with possible mass effect on the right L5 nerve root without significant spinal stenosis. There are mild facet spurs without foraminal stenosis. L5-S1, there is no significant disc bulge or canal stenosis. There is mild posterior uncovering of the disc. There is foraminal disc bulging bilaterally with pars defects and grade 1 spondylolisthesis with moderate acquired foraminal stenosis. . The SI joints are patent, both showing mild spurring and vacuum phenomenon. SOFT TISSUES: No acute abnormality. IMPRESSION: 1. Mild acute superior endplate anterior wedge compression fracture of the T11 vertebral body without retropulsion, spinal canal hematoma, or posterior element  involvement, with approximately 20% anterior and central height loss. 2. Shallow left paracentral disc protrusion at T12-L1. 3. Moderate right paracentral disc protrusion at L1-L2, likely causing mass effect on the right L2 nerve root in the subarticular zone, age indeterminate. 4. Small right paracentral disc protrusion at L4-L5 with possible mass effect on the right L5 nerve root without significant spinal stenosis. 5. 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 No acute lumbar fracture. 6. Thoracic and lumbar spondylosis and additional degenerative findings discussed above. . Electronically signed by: Francis Quam MD 04/27/2024 08:54 PM EST RP Workstation: [id number]   CT T-SPINE NO CHARGE Result Date: 04/27/2024 EXAM: CT THORACIC AND LUMBAR SPINE WITH INTRAVENOUS CONTRAST 04/27/2024 06:13:36 PM TECHNIQUE: CT of the thoracic and lumbar spine was performed with the administration of 75 mL of iohexol  (OMNIPAQUE ) 350 MG/ML injection. Multiplanar reformatted images are provided for review. Automated exposure control, iterative reconstruction, and/or weight based adjustment of the mA/kV was utilized to reduce the radiation dose to as low as reasonably achievable. Incidental adrenal and/or renal findings do not require follow up imaging. COMPARISON: Limited comparison is available with PA lateral chest 06/30/16. No prior cross-sectional imaging other than today's body CT. No prior lumbar spine films or imaging. CLINICAL HISTORY: FINDINGS: THORACIC SPINE: BONES AND ALIGNMENT: Normal alignment. Mild acute superior endplate anterior wedge compression fracture of the T11 vertebral body, without retropulsion, spinal canal hematoma or posterior element involvement. There is no appreciable posterior height loss. There is 20 percent loss of the anterior and central vertebral body height at this  level. Slight wedging of the T12 vertebral body is noted but was also present on the prior PA and lateral chest and unchanged. Remaining thoracic vertebrae are normal in height. No focal pathologic bone lesion. No paravertebral hematoma or fluid collection. DEGENERATIVE CHANGES: Thoracic spondylosis without bulky or bridging osteophytes. The discs are mildly degenerated at most levels. There is a shallow left paracentral disc protrusion at T12-L1, mildly deforming the left ventral cord surface. Other levels do not show evidence of herniated discs or cord encroachment. The thoracic facet joints are normally aligned, with mild spurring at the lower levels. Thoracic spine intervertebral foramina are patent with the exception of mild to moderate right foraminal stenosis at T7-T8, and bilateral moderate foraminal stenosis at T8-T9 and T9-T10. SOFT TISSUES: No acute abnormality. 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 The lumbar alignment  is otherwise normal. No acute lumbar fracture is seen. Slight wedging of the L1 body, which was present on the prior PA and lateral chest. This is unchanged. The other lumbar vertebrae are normal in height. All pedicles and posterior elements are unremarkable. Hemangioma to the left in the L1 vertebral body. No focal pathologic bone lesion. DEGENERATIVE CHANGES: Mild disc space loss of L1-L2. The discs are normal in height below this. Mild lumbar spondylosis. At L1-L2, there is a moderate right paracentral disc protrusion. This causes likely mass effect on the right L2 nerve root in the subarticular zone but is age indeterminate. At L2-L3, there is a mild diffuse nonstenosing disc bulge without herniation or stenosis. L3-L4 is unremarkable. . At L4-L5 there is a small right paracentral disc protrusion with possible mass effect on the right L5 nerve root without significant spinal stenosis. There  are mild facet spurs without foraminal stenosis. L5-S1, there is no significant disc bulge or canal stenosis. There is mild posterior uncovering of the disc. There is foraminal disc bulging bilaterally with pars defects and grade 1 spondylolisthesis with moderate acquired foraminal stenosis. . The SI joints are patent, both showing mild spurring and vacuum phenomenon. SOFT TISSUES: No acute abnormality. IMPRESSION: 1. Mild acute superior endplate anterior wedge compression fracture of the T11 vertebral body without retropulsion, spinal canal hematoma, or posterior element involvement, with approximately 20% anterior and central height loss. 2. Shallow left paracentral disc protrusion at T12-L1. 3. Moderate right paracentral disc protrusion at L1-L2, likely causing mass effect on the right L2 nerve root in the subarticular zone, age indeterminate. 4. Small right paracentral disc protrusion at L4-L5 with possible mass effect on the right L5 nerve root without significant spinal stenosis. 5. 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 No acute lumbar fracture. 6. Thoracic and lumbar spondylosis and additional degenerative findings discussed above. . Electronically signed by: Francis Quam MD 04/27/2024 08:54 PM EST RP Workstation: [id number]   CT CHEST ABDOMEN PELVIS W CONTRAST Result Date: 04/27/2024 EXAM: CT CHEST, ABDOMEN AND PELVIS WITH CONTRAST 04/27/2024 06:13:36 PM TECHNIQUE: CT of the chest, abdomen and pelvis was performed with the administration of 75 mL of iohexol  (OMNIPAQUE ) 350 MG/ML injection. Multiplanar reformatted images are provided for review. Automated exposure control, iterative reconstruction, and/or weight based adjustment of the mA/kV was utilized to reduce the radiation dose to as low as reasonably achievable. COMPARISON: AP portable pelvis from today, portable  chest from today, portable chest from 09/04/2018 and PA and lateral chest 06/30/16. No prior cross-sectional imaging for comparison. CLINICAL HISTORY: Polytrauma, blunt; fall 10 ft, left and central CP, posterior scalp hematoma, mid back pain. FINDINGS: CHEST: MEDIASTINUM AND LYMPH NODES: The heart is slightly enlarged. There is no pericardial effusion, no visible coronary or aortic calcifications. The aorta and great vessels are normal apart from mild descending aorta tortuosity. There is no aneurysm, dissection, or stenosis. The pulmonary arteries and veins are normal in caliber. Thoracic esophagus is unremarkable. 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 No mediastinal, hilar or axillary lymphadenopathy. LUNGS AND PLEURA: There is a calcified granuloma in the right upper lobe. Mild posterior atelectasis in both lungs. No pulmonary consolidation, pleural effusion, or pneumothorax are seen and no noncalcified nodules. ABDOMEN AND PELVIS: LIVER: The liver is 20 cm in length, mildly steatotic. There is a tiny hypodensity  anteriorly segment 2, which is too small to characterize. No follow-up imaging is needed in a low-risk patient. In a high-risk patient, 68-month follow-up liver dedicated MRI could be considered. GALLBLADDER AND BILE DUCTS: Gallbladder is unremarkable. No biliary ductal dilatation. SPLEEN: No acute abnormality. PANCREAS: No acute abnormality. ADRENAL GLANDS: No acute abnormality. No acute traumatic injury to the adrenal glands is seen. No adrenal mass. KIDNEYS, URETERS AND BLADDER: There is a solitary punctate nonobstructive calyceal stone in the upper pole of the left kidney. There is no hydronephrosis or ureteral calculus. No perinephric or periureteral stranding. Urinary bladder is unremarkable. No acute traumatic injury to the kidneys is seen. No renal mass enhancement. GI AND BOWEL: Stomach demonstrates no acute abnormality. There  is no bowel obstruction. The appendix is normal. Sigmoid diverticulosis is noted without diverticulitis. REPRODUCTIVE ORGANS: No acute abnormality. PERITONEUM AND RETROPERITONEUM: No ascites. No free air. No free hemorrhage. No mesenteric contusion is seen. VASCULATURE: Aorta is normal in caliber. ABDOMINAL AND PELVIS LYMPH NODES: No lymphadenopathy. BONES AND SOFT TISSUES: There is an acute mild upper plate anterior wedge compression fracture of the T11 vertebral body, without retropulsion or significant posterior height loss. There is 20 percent anterior and central vertebral height loss. All pedicles and posterior elements are intact. There is slight wedging of the T12 vertebral body which was present on the 2018 PA and lateral chest and unchanged. . There are mild degenerative changes of the spine. No paraspinal hematoma, no spinal canal hemorrhage is seen. All other thoracic vertebrae are normal in height. There are chronic-appearing L5 pars defects with a grade 1 L5-S1 spondylolisthesis. Associated L5-S1 foraminal stenosis appears moderate. No fractures are evident at the abdominopelvic levels. There is mild sternal pectus deformity. The rib cage is intact. Visualized shoulder girdles and sternum are intact. . There is a small umbilical fat hernia. No incarcerated hernia. No focal soft tissue abnormality. IMPRESSION: 1. Acute mild upper plate anterior wedge compression fracture of the T11 vertebral body, without retropulsion or significant posterior height loss, with 20 percent anterior and central vertebral height loss. 2. Slight wedging of the T12 vertebral body, unchanged from prior imaging. 3. Solitary punctate nonobstructive calyceal stone in the upper pole of the left kidney, without hydronephrosis or ureteral calculus. 4. Sigmoid diverticulosis without diverticulitis. 5. Mildly steatotic liver with a tiny hypodensity in segment 2 that is too small to characterize; no follow-up imaging is needed in a  low-risk patient, in a high-risk patient a 2-month follow-up liver-dedicated MRI could be considered. 6. Slight cardiomegaly. 7. Umbilical hernia containing fat. Electronically signed by: Francis Quam MD 04/27/2024 08:26 PM EST RP Workstation: [id number]   DG Pelvis Portable Result Date: 04/27/2024 CLINICAL DATA:  Clemens, trauma EXAM: PORTABLE PELVIS 1-2 VIEWS COMPARISON:  None Available. FINDINGS: Supine frontal view of the pelvis includes both hips. No acute fracture. Joint spaces are well preserved. Alignment is anatomic. Sacroiliac joints are normal. IMPRESSION: 1. Unremarkable pelvis and bilateral hips. Electronically Signed   By: Ozell Daring M.D.   On: 04/27/2024 18:51   DG Foot Complete Right Result Date: 04/27/2024 CLINICAL DATA:  Pain after fall EXAM: RIGHT FOOT COMPLETE - 3+ VIEW COMPARISON:  None Available. FINDINGS: Frontal, oblique, and lateral views of the right foot are obtained. There is a comminuted intra-articular fracture through the base of the first distal phalanx, with mild displacement of the fracture fragments. Prominent overlying soft tissue swelling. No other acute bony abnormalities. 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  IMPRESSION: 1. Comminuted intra-articular fracture at the base of the first distal phalanx, with overlying soft tissue swelling. 2. 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 Electronically Signed   By: Ozell Daring M.D.   On: 04/27/2024 18:51   CT CERVICAL SPINE WO CONTRAST Result Date: 04/27/2024 CLINICAL DATA:  Clemens, loss of consciousness EXAM: CT CERVICAL SPINE WITHOUT CONTRAST TECHNIQUE: Multidetector CT imaging of the cervical spine was performed without intravenous contrast. Multiplanar CT image reconstructions were also generated. RADIATION DOSE REDUCTION: This exam was performed according to the departmental dose-optimization program which includes automated  exposure control, adjustment of the mA and/or kV according to patient size and/or use of iterative reconstruction technique. COMPARISON:  None Available. FINDINGS: Alignment: Alignment is anatomic. Skull base and vertebrae: No acute fracture. No primary bone lesion or focal pathologic process. Soft tissues and spinal canal: No prevertebral fluid or swelling. No visible canal hematoma. Disc levels:  No significant spondylosis or facet hypertrophy. Upper chest: Airway is patent. Lung apices are clear. There is a rounded gas lucency in the right paratracheal region in the upper mediastinum, likely a tracheal diverticulum. Other: Reconstructed images demonstrate no additional findings. IMPRESSION: 1. No acute cervical spine fracture. 2. 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 Electronically Signed   By: Ozell Daring M.D.   On: 04/27/2024 18:48   CT HEAD WO CONTRAST Result Date: 04/27/2024 CLINICAL DATA:  Clemens 10 feet, head trauma EXAM: CT HEAD WITHOUT CONTRAST TECHNIQUE: Contiguous axial images were obtained from the base of the skull through the vertex without intravenous contrast. RADIATION DOSE REDUCTION: This exam was performed according to the departmental dose-optimization program which includes automated exposure control, adjustment of the mA and/or kV according to patient size and/or use of iterative reconstruction technique. COMPARISON:  12/10/2019 FINDINGS: Brain: No acute infarct or hemorrhage. Lateral ventricles and midline structures are unremarkable. No acute extra-axial fluid collections. No mass effect. Vascular: No hyperdense vessel or unexpected calcification. Skull: Large scalp hematoma along the parietal convexity and extending into the right occipital region. No underlying fracture. The remainder of the calvarium is unremarkable. Sinuses/Orbits: No acute finding. Other: None. IMPRESSION: 1. Large  scalp hematoma along the parietal convexity extending to the right occipital region. No underlying fracture. 2. No acute intracranial process. Electronically Signed   By: Ozell Daring M.D.   On: 04/27/2024 18:45   DG Chest Port 1 View Result Date: 04/27/2024 CLINICAL DATA:  Clemens 10 feet, loss of consciousness EXAM: PORTABLE CHEST 1 VIEW COMPARISON:  09/04/2018 FINDINGS: Single frontal view of the chest demonstrates an unremarkable cardiac silhouette. No acute airspace disease, effusion, or pneumothorax. No acute bony abnormalities. IMPRESSION: 1. No acute intrathoracic process. Electronically Signed   By: Ozell Daring M.D.   On: 04/27/2024 18:43     .Laceration Repair  Date/Time: 04/27/2024 9:05 PM  Performed by: Desiderio Chew, PA-C Authorized by: Desiderio Chew, PA-C   Consent:    Consent obtained:  Verbal   Consent given by:  Patient and spouse   Risks discussed:  Pain Universal protocol:    Patient identity confirmed:  Verbally with patient and provided demographic data Anesthesia:    Anesthesia method:  Local infiltration   Local anesthetic:  Lidocaine  2% WITH epi Laceration details:    Location:  Scalp   Scalp location:  Occipital   Length (cm):  2 Pre-procedure details:    Preparation:  Patient was prepped and  draped in usual sterile fashion and imaging obtained to evaluate for foreign bodies Exploration:    Imaging obtained comment:  CT head   Imaging outcome: foreign body not noted     Wound exploration: wound explored through full range of motion   Treatment:    Area cleansed with:  Shur-Clens   Amount of cleaning:  Extensive Skin repair:    Repair method:  Staples   Number of staples:  2 Approximation:    Approximation:  Close Repair type:    Repair type:  Simple Post-procedure details:    Dressing:  Open (no dressing)   Procedure completion:  Tolerated well, no immediate complications    Medications Ordered in the ED  fentaNYL  (SUBLIMAZE ) injection 100  mcg (100 mcg Intravenous Given 04/27/24 1743)  ondansetron  (ZOFRAN ) injection 4 mg (4 mg Intravenous Given 04/27/24 1743)  iohexol  (OMNIPAQUE ) 350 MG/ML injection 75 mL (75 mLs Intravenous Contrast Given 04/27/24 1814)  lidocaine -EPINEPHrine  (XYLOCAINE  W/EPI) 2 %-1:200000 (PF) injection 10 mL (10 mLs Infiltration Given 04/27/24 1948)  oxyCODONE  (Oxy IR/ROXICODONE ) immediate release tablet 5 mg (5 mg Oral Given 04/27/24 2103)  ketorolac  (TORADOL ) 15 MG/ML injection 15 mg (15 mg Intravenous Given 04/27/24 2102)  HYDROmorphone  (DILAUDID ) injection 0.5 mg (0.5 mg Intravenous Given 04/27/24 2103)   ED Course  Patient seen and examined. History obtained directly from patient.  Spanish video interpreter utilized.  Labs/EKG: Ordered trauma panel ordered including coagulation panel ordered as part of the set.  Imaging: Ordered 1 view chest x-ray, 1 view pelvis.  X-ray of the right foot.  CT scan of the head and cervical spine.  CT scan of the chest, abdomen, pelvis.  Medications/Fluids: Ordered: Fentanyl  100 mcg, Zofran  4 mg IV.   Most recent vital signs reviewed and are as follows: BP 137/83 (BP Location: Right Arm)   Pulse 73   Temp 98.9 F (37.2 C) (Oral)   Resp (!) 21   Ht 5' 1.02 (1.55 m)   Wt 92.1 kg   SpO2 98%   BMI 38.33 kg/m   Initial impression: Patient with fall, reportedly approximately 10 feet.  He sustained injury to the back of his head.  Also complains of chest pain and right foot pain.  Also back pain.  Hemodynamically stable at time of exam.  No signs of hemorrhage, impending airway compromise, breathing is stable with normal pulse ox no evidence of pneumothorax on x-ray, overall low concern for internal hemorrhage, blood pressures remain normal.  Currently evaluating for head injury however GCS is 15.  Provided warm blankets.  Bedside FAST negative.  EMERGENCY DEPARTMENT US  FAST EXAM Limited Ultrasound of the Abdomen and Pericardium (FAST  Exam).   INDICATIONS:Blunt trauma to the thorax Multiple views of the abdomen and pericardium are obtained with a multi-frequency probe.  PERFORMED BY: Myself IMAGES ARCHIVED?: Yes LIMITATIONS:  Body habitus and Emergent procedure INTERPRETATION:  No abdominal free fluid and No pericardial effusion  9:05 PM Reassessment performed. Patient appears stable during ED stay.  I evaluated scalp wound, cleaned, anesthetized and repaired as above.  Labs personally reviewed and interpreted including: CBC with normal white blood cell count and hemoglobin; CMP glucose 136 otherwise unremarkable; lactate was normal; ethanol negative; PT/INR was normal.  Imaging personally visualized and interpreted including: X-ray of the pelvis and chest, agree negative.  X-ray of the right foot shows comminuted intra-articular fracture at the base of the first distal phalanx --recommended cam walker and crutches, patient states that he has a walking boot and  crutches at home that he would like to use from previous injury, will provide with postop shoe here; CT head shows large scalp hematoma but no intracranial process; CT cervical spine no acute findings; CT chest, abdomen, pelvis shows acute T11 compression fracture, chronic T12 compression fracture, intrarenal stone, sigmoid diverticulosis, steatotic liver; reconstruction CT thoracic and lumbar spine also consistent with T11 compression, no evidence of retropulsion.  Reviewed pertinent lab work and imaging with patient and wife at bedside. Questions answered.   Most current vital signs reviewed and are as follows: BP 124/70 (BP Location: Left Arm)   Pulse 98   Temp 98.9 F (37.2 C) (Oral)   Resp 14   Ht 5' 1.02 (1.55 m)   Wt 92.1 kg   SpO2 98%   BMI 38.33 kg/m   Plan: Discharge to home.  Will plan to give additional medications prior to discharge for pain control.  Prescriptions written for: Oxycodone  # 10 tablets, Robaxin , naproxen   Other home care  instructions discussed: RICE protocol.  I emphasized the importance of follow-up.  Discussed that he will need follow-up in regards to his scalp laceration for staple removal, discussed importance of orthopedic follow-up given his intra-articular toe fracture, and discussed that his spine fracture will likely be a painful injury that should gradually improve, but strongly recommended spine follow-up.  ED return instructions discussed: Uncontrolled pain, weakness, numbness, or tingling in the arms or legs, vomiting, new or worsening symptoms.  Patient counseled on wound care. Patient counseled on need to return or see PCP/urgent care for suture removal in 5-7 days. Patient was urged to return to the Emergency Department urgently with worsening pain, swelling, expanding erythema especially if it streaks away from the affected area, fever, or if they have any other concerns. Patient verbalized understanding.   CRITICAL CARE Performed by: Fonda Ruby PA-C Total critical care time: 40 minutes Critical care time was exclusive of separately billable procedures and treating other patients. Critical care was necessary to treat or prevent imminent or life-threatening deterioration. Critical care was time spent personally by me on the following activities: development of treatment plan with patient and/or surrogate as well as nursing, discussions with consultants, evaluation of patient's response to treatment, examination of patient, obtaining history from patient or surrogate, ordering and performing treatments and interventions, ordering and review of laboratory studies, ordering and review of radiographic studies, pulse oximetry and re-evaluation of patient's condition.                                    Medical Decision Making Amount and/or Complexity of Data Reviewed Labs: ordered. Radiology: ordered.  Risk Prescription drug management.   Patient with significant fall with loss of  consciousness, approximately 10 feet.  Patient with head injury: No obvious concussion symptoms in the ED today, but this is not ruled out.  Head CT was negative for intracranial bleeding.  No focal neurologic deficits or decompensation during ED stay.  Patient had a scalp laceration which was repaired after cleaning.  Chest pain: No evidence of cardiac contusion or cardiac instability.  No tachycardia or hypotension.  Imaging negative for pneumothorax, hemothorax, pulmonary contusion, broken ribs, sternal fracture.  Back pain: Patient has a T11 compression fracture without any neurologic deficits corresponding with his back pain.  This will need pain control and outpatient follow-up.  No neurologic deficits.  Right foot pain: Patient has a great toe fracture, intra-articular,  which will require orthopedic follow-up.  He would like to use cam walker and crutches that he has at home to assist with mobility.     Final diagnoses:  Laceration of scalp, initial encounter  Hematoma of scalp, initial encounter  Closed wedge compression fracture of T11 vertebra, initial encounter (HCC)  Closed displaced fracture of distal phalanx of right great toe, initial encounter  Contusion of chest wall, unspecified laterality, initial encounter  Fall, initial encounter    ED Discharge Orders     None          Desiderio Chew, DEVONNA 04/27/24 2129    Dean Clarity, MD 04/27/24 2236

## 2024-04-27 NOTE — ED Notes (Signed)
 Ortho tech called to place cam boot and fit for crutches

## 2024-04-27 NOTE — Discharge Instructions (Signed)
 Please read and follow all provided instructions.  Your diagnoses today include:  1. Laceration of scalp, initial encounter   2. Hematoma of scalp, initial encounter   3. Closed wedge compression fracture of T11 vertebra, initial encounter (HCC)   4. Closed displaced fracture of distal phalanx of right great toe, initial encounter   5. Contusion of chest wall, unspecified laterality, initial encounter   6. Fall, initial encounter     Tests performed today include: CT scan of your head and neck: Showed scalp laceration and scalp hematoma, no internal bleeding CT scan of your chest, abdomen, pelvis: No serious internal injuries noted CT scan of your mid back and lower back: Mild acute superior endplate anterior wedge compression fracture of the T11 vertebral body without retropulsion, spinal canal hematoma, or posterior element involvement, with approximately 20% anterior and central height loss.  X-ray of your right foot shows a broken toe: Please use the walking boot that you have at home Vital signs. See below for your results today.   Medications prescribed:  Oxycodone  - narcotic pain medication  DO NOT drive or perform any activities that require you to be awake and alert because this medicine can make you drowsy.   Robaxin  (methocarbamol ) - muscle relaxer medication  DO NOT drive or perform any activities that require you to be awake and alert because this medicine can make you drowsy.   Naproxen  - anti-inflammatory pain medication Do not exceed 500mg  naproxen  every 12 hours, take with food  You have been prescribed an anti-inflammatory medication or NSAID. Take with food. Take smallest effective dose for the shortest duration needed for your pain. Stop taking if you experience stomach pain or vomiting.   Take any prescribed medications only as directed.  Home care instructions:  Follow any educational materials contained in this packet.  BE VERY CAREFUL not to take multiple  medicines containing Tylenol  (also called acetaminophen ). Doing so can lead to an overdose which can damage your liver and cause liver failure and possibly death.   Follow-up instructions: For recheck of your symptoms and removal of the staples in your scalp: You will need to follow-up with your doctor in 1 week  For treatment and management of your thoracic spine compression fracture: You should follow-up with neurosurgery referral provided  For treatment and management of your toe fracture: You should follow-up with orthopedic referral provided, this is especially important because the fracture involves the joint.  Return instructions:  SEEK IMMEDIATE MEDICAL ATTENTION IF: There is confusion or drowsiness (although children frequently become drowsy after injury).  You cannot awaken the injured person.  You have more than one episode of vomiting.  You notice dizziness or unsteadiness which is getting worse, or inability to walk.  You have convulsions or unconsciousness.  You experience severe, persistent headaches not relieved by Tylenol . You cannot use arms or legs normally.  There are changes in pupil sizes. (This is the black center in the colored part of the eye)  There is clear or bloody discharge from the nose or ears.  You have change in speech, vision, swallowing, or understanding.  Localized weakness, numbness, tingling, or change in bowel or bladder control. You have any other emergent concerns.  Additional Information: You have had a head injury which does not appear to require admission at this time.  Your vital signs today were: BP 124/70 (BP Location: Left Arm)   Pulse 98   Temp 98.9 F (37.2 C) (Oral)   Resp 14  Ht 5' 1.02 (1.55 m)   Wt 92.1 kg   SpO2 98%   BMI 38.33 kg/m  If your blood pressure (BP) was elevated above 135/85 this visit, please have this repeated by your doctor within one month. --------------

## 2024-04-27 NOTE — ED Triage Notes (Signed)
 Pt BIBA s/p pt fall 10 ft from platform at work, per pt coworkers pt did hit his head with positive LOC. Pt is not on blood thinners. Pt c/o right ankle pain. Interpreter used during triage
